# Patient Record
Sex: Female | Born: 1965 | Race: Black or African American | Hispanic: No | Smoking: Never smoker
Health system: Southern US, Community
[De-identification: ages and names within clinical notes are randomized; demographics above are authoritative.]

## PROBLEM LIST (undated history)

## (undated) DIAGNOSIS — I4891 Unspecified atrial fibrillation: Secondary | ICD-10-CM

## (undated) DIAGNOSIS — I10 Essential (primary) hypertension: Secondary | ICD-10-CM

## (undated) DIAGNOSIS — I509 Heart failure, unspecified: Secondary | ICD-10-CM

## (undated) HISTORY — PX: BACK SURGERY: SHX140

## (undated) HISTORY — PX: CARDIOVERSION: SHX1299

---

## 1999-09-10 ENCOUNTER — Emergency Department (HOSPITAL_COMMUNITY): Admission: EM | Admit: 1999-09-10 | Discharge: 1999-09-10 | Payer: Self-pay

## 2017-11-09 ENCOUNTER — Encounter (HOSPITAL_COMMUNITY): Payer: Self-pay | Admitting: Family Medicine

## 2017-11-09 ENCOUNTER — Ambulatory Visit (HOSPITAL_COMMUNITY)
Admission: EM | Admit: 2017-11-09 | Discharge: 2017-11-09 | Disposition: A | Discharge status: Home / Self Care | Payer: BLUE CROSS/BLUE SHIELD | Attending: Family Medicine | Admitting: Family Medicine

## 2017-11-09 ENCOUNTER — Other Ambulatory Visit: Payer: Self-pay

## 2017-11-09 DIAGNOSIS — J40 Bronchitis, not specified as acute or chronic: Secondary | ICD-10-CM

## 2017-11-09 HISTORY — DX: Essential (primary) hypertension: I10

## 2017-11-09 MED ORDER — HYDROCODONE-HOMATROPINE 5-1.5 MG/5ML PO SYRP: 5.0000 mL | ORAL_SOLUTION | Freq: Four times a day (QID) | ORAL | 0 refills | Status: DC | PRN

## 2017-11-09 MED ORDER — AMOXICILLIN 875 MG PO TABS: 875.0000 mg | ORAL_TABLET | Freq: Two times a day (BID) | ORAL | 0 refills | Status: DC

## 2017-11-09 NOTE — ED Triage Notes (Signed)
Pt reports nasal and chest congestion, productive cough, hoarseness and sore throat x5 days.  She also reports nightly fevers up to 101.  She has been taking OTC medication with no relief.

## 2017-11-09 NOTE — ED Provider Notes (Signed)
[facility]    CSN: [id number] Arrival date & time: 11/09/17  1418     History   Chief Complaint Chief Complaint  Patient presents with  . URI    HPI Katelyn Preston is a 52 y.o. female.   Pt reports nasal and chest congestion, productive cough, hoarseness and sore throat x5 days.  She also reports nightly fevers up to 101.  She has been taking OTC medication with no relief.  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 Hypertension     There are no active problems to display for this patient.   Past Surgical History:  Procedure Laterality Date  . BACK SURGERY    . CESAREAN SECTION      OB History   None      Home Medications    Prior to Admission medications   Medication Sig Start Date End Date Taking? Authorizing Provider  lisinopril (PRINIVIL,ZESTRIL) 20 MG tablet Take 20 mg by mouth 2 (two) times daily. 12/05/16  Yes [provider]  amoxicillin (AMOXIL) 875 MG tablet Take 1 tablet (875 mg total) by mouth 2 (two) times daily. 11/09/17   Elvina Sidle, MD  HYDROcodone-homatropine (HYDROMET) 5-1.5 MG/5ML syrup Take 5 mLs by mouth every 6 (six) hours as needed for cough. 11/09/17   Elvina Sidle, MD    Family History No family history on file.  Social History Social History   Tobacco Use  . Smoking status: Never Smoker  . Smokeless tobacco: Never Used  Substance Use Topics  . Alcohol use: Never    Frequency: Never  . Drug use: Never     Allergies   Patient has no known allergies.   Review of Systems Review of Systems  Constitutional: Positive for fever.  HENT: Positive for congestion and sore throat.   Respiratory: Positive for cough.   Gastrointestinal: Negative.   All other systems reviewed and are negative.    Physical Exam Triage Vital Signs ED Triage Vitals [11/09/17 1448]  Enc Vitals Group     BP      Pulse Rate 68     Resp      Temp    Temp Source Oral     SpO2 99 %     Weight      Height      Head Circumference      Peak Flow      Pain Score      Pain Loc      Pain Edu?      Excl. in GC?    No data found.  Updated Vital Signs BP (!) 178/113 (BP Location: Left Wrist)   Pulse 68   Temp 98.8 F (37.1 C) (Oral)   SpO2 99%    Physical Exam  Constitutional: She is oriented to person, place, and time. She appears well-developed and well-nourished.  HENT:  Head: Normocephalic.  Right Ear: External ear normal.  Left Ear: External ear normal.  Mouth/Throat: Oropharynx is clear and moist.  Normal TMs normal throat Hoarse  Eyes: Conjunctivae are normal.  Neck: Normal range of motion. Neck supple.  Cardiovascular: Normal rate, regular rhythm and normal heart sounds.  Pulmonary/Chest: Effort normal and breath sounds normal.  Musculoskeletal: Normal range of motion.  Neurological: She is alert and oriented to person, place, and time.  Skin: Skin is warm and dry.  Psychiatric: She has a normal mood and affect.  Nursing note and vitals reviewed.    UC Treatments / Results  Labs (all labs ordered are listed, but only abnormal results are displayed) Labs Reviewed - No data to display  EKG None  Radiology No results found.  Procedures Procedures (including critical care time)  Medications Ordered in UC Medications - No data to display  Initial Impression / Assessment and Plan / UC Course  I have reviewed the triage vital signs and the nursing notes.  Pertinent labs & imaging results that were available during my care of the patient were reviewed by me and considered in my medical decision making (see chart for details).     Final Clinical Impressions(s) / UC Diagnoses   Final diagnoses:  Bronchitis   Discharge Instructions   None    ED Prescriptions    Medication Sig Dispense Auth. Provider   amoxicillin (AMOXIL) 875 MG tablet Take 1 tablet (875 mg total) by mouth 2 (two) times daily. 20  tablet Elvina Sidle, MD   HYDROcodone-homatropine (HYDROMET) 5-1.5 MG/5ML syrup Take 5 mLs by mouth every 6 (six) hours as needed for cough. 60 mL Elvina Sidle, MD     Controlled Substance Prescriptions [place] Controlled Substance Registry consulted? Not Applicable   Elvina Sidle, MD 11/09/17 1500

## 2017-11-13 ENCOUNTER — Encounter (HOSPITAL_COMMUNITY): Payer: Self-pay | Admitting: Emergency Medicine

## 2017-11-13 ENCOUNTER — Ambulatory Visit (HOSPITAL_COMMUNITY)
Admission: EM | Admit: 2017-11-13 | Discharge: 2017-11-13 | Disposition: A | Discharge status: Home / Self Care | Payer: BLUE CROSS/BLUE SHIELD | Attending: Family Medicine | Admitting: Family Medicine

## 2017-11-13 ENCOUNTER — Ambulatory Visit (INDEPENDENT_AMBULATORY_CARE_PROVIDER_SITE_OTHER): Payer: BLUE CROSS/BLUE SHIELD

## 2017-11-13 DIAGNOSIS — R05 Cough: Secondary | ICD-10-CM | POA: Diagnosis not present

## 2017-11-13 DIAGNOSIS — J40 Bronchitis, not specified as acute or chronic: Secondary | ICD-10-CM

## 2017-11-13 MED ORDER — ALBUTEROL SULFATE (2.5 MG/3ML) 0.083% IN NEBU
INHALATION_SOLUTION | RESPIRATORY_TRACT | Status: AC
  Filled 2017-11-13: qty 3

## 2017-11-13 MED ORDER — METHYLPREDNISOLONE ACETATE 80 MG/ML IJ SUSP
INTRAMUSCULAR | Status: AC
  Filled 2017-11-13: qty 1

## 2017-11-13 MED ORDER — ALBUTEROL SULFATE (2.5 MG/3ML) 0.083% IN NEBU
2.5000 mg | INHALATION_SOLUTION | Freq: Once | RESPIRATORY_TRACT | Status: AC
  Administered 2017-11-13: 2.5 mg via RESPIRATORY_TRACT

## 2017-11-13 MED ORDER — METHYLPREDNISOLONE ACETATE 80 MG/ML IJ SUSP
80.0000 mg | Freq: Once | INTRAMUSCULAR | Status: AC
  Administered 2017-11-13: 80 mg via INTRAMUSCULAR

## 2017-11-13 MED ORDER — ALBUTEROL SULFATE HFA 108 (90 BASE) MCG/ACT IN AERS: 2.0000 | INHALATION_SPRAY | RESPIRATORY_TRACT | 1 refills | Status: AC | PRN

## 2017-11-13 MED ORDER — METHYLPREDNISOLONE SODIUM SUCC 125 MG IJ SOLR
INTRAMUSCULAR | Status: AC
  Filled 2017-11-13: qty 2

## 2017-11-13 NOTE — ED Provider Notes (Signed)
[facility]    CSN: [id number] Arrival date & time: 11/13/17  1045     History   Chief Complaint Chief Complaint  Patient presents with  . URI    HPI Katelyn Preston is a 52 y.o. female.   Pt here for URI sx not resolved since last visit.  Still hoarse and having trouble with uncontrollable cough.  At times she gags she is coughing so hard.  She thinks she has had a fever because she has had diaphoresis at night.  She is not able to go back to work because of the symptoms.  She has had this kind of problem before and required steroids to clear the symptoms.    Note from 10/ 4: Pt reports nasal and chest congestion, productive cough, hoarseness and sore throat x5 days.  She also reports nightly fevers up to 101.  She has been taking OTC medication with no relief.  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 Hypertension     There are no active problems to display for this patient.   Past Surgical History:  Procedure Laterality Date  . BACK SURGERY    . CESAREAN SECTION      OB History   None      Home Medications    Prior to Admission medications   Medication Sig Start Date End Date Taking? Authorizing Provider  albuterol (PROVENTIL HFA;VENTOLIN HFA) 108 (90 Base) MCG/ACT inhaler Inhale 2 puffs into the lungs every 4 (four) hours as needed for wheezing or shortness of breath (cough, shortness of breath or wheezing.). 11/13/17   Elvina Sidle, MD  amoxicillin (AMOXIL) 875 MG tablet Take 1 tablet (875 mg total) by mouth 2 (two) times daily. 11/09/17   Elvina Sidle, MD  lisinopril (PRINIVIL,ZESTRIL) 20 MG tablet Take 20 mg by mouth 2 (two) times daily. 12/05/16   [provider]    Family History History reviewed. No pertinent family history.  Social History Social History   Tobacco Use  . Smoking status: Never Smoker  . Smokeless tobacco: Never Used  Substance Use  Topics  . Alcohol use: Never    Frequency: Never  . Drug use: Never     Allergies   Patient has no known allergies.   Review of Systems Review of Systems  Constitutional: Positive for diaphoresis and fatigue.  HENT: Positive for congestion, postnasal drip and sore throat.   Respiratory: Positive for cough and wheezing.   Gastrointestinal: Positive for vomiting.  All other systems reviewed and are negative.    Physical Exam Triage Vital Signs ED Triage Vitals [11/13/17 1120]  Enc Vitals Group     BP (!) 175/109     Pulse Rate 71     Resp 18     Temp 98.1 F (36.7 C)     Temp Source Oral     SpO2 99 %     Weight      Height      Head Circumference      Peak Flow      Pain Score      Pain Loc      Pain Edu?      Excl. in GC?    No data found.  Updated Vital Signs BP (!) 175/109 (BP Location: Left Arm)   Pulse 71   Temp 98.1 F (36.7 C) (Oral)   Resp 18   SpO2  99%    Physical Exam  Constitutional: She is oriented to person, place, and time. She appears well-developed and well-nourished.  HENT:  Right Ear: External ear normal.  Left Ear: External ear normal.  Mouth/Throat: Oropharynx is clear and moist.  Hoarse  Eyes: Pupils are equal, round, and reactive to light. Conjunctivae are normal.  Neck: Normal range of motion. Neck supple.  Pulmonary/Chest: Effort normal. She has wheezes.  Musculoskeletal: Normal range of motion.  Neurological: She is alert and oriented to person, place, and time.  Skin: Skin is warm and dry.  Psychiatric: She has a normal mood and affect.  Somewhat anxious.  Patient is seen with her husband who is quite upset with the situation.  Nursing note and vitals reviewed.    UC Treatments / Results  Labs (all labs ordered are listed, but only abnormal results are displayed) Labs Reviewed - No data to display  EKG None  Radiology Dg Chest 2 View  Result Date: 11/13/2017 CLINICAL DATA:  Four days of productive cough, sore  throat, chills, and night sweats. History of bronchitis. Nonsmoker. EXAM: CHEST - 2 VIEW COMPARISON:  None. FINDINGS: The lungs are adequately inflated. The lung markings are coarse in the right lower lobe. There are no air bronchograms. The heart is top-normal in size. The pulmonary vascularity is not engorged. The mediastinum is normal in width. The bony thorax is unremarkable. IMPRESSION: Bronchitic changes which may be acute or chronic. No alveolar pneumonia nor pulmonary edema. Electronically Signed   By: David  [place] M.D.   On: 11/13/2017 12:16    Procedures Procedures (including critical care time)  Medications Ordered in UC Medications  methylPREDNISolone acetate (DEPO-MEDROL) injection 80 mg (has no administration in time range)  albuterol (PROVENTIL) (2.5 MG/3ML) 0.083% nebulizer solution 2.5 mg (2.5 mg Nebulization Given 11/13/17 1216)    Initial Impression / Assessment and Plan / UC Course  I have reviewed the triage vital signs and the nursing notes.  Pertinent labs & imaging results that were available during my care of the patient were reviewed by me and considered in my medical decision making (see chart for details).    Final Clinical Impressions(s) / UC Diagnoses   Final diagnoses:  Bronchitis     Discharge Instructions     EXAM: CHEST - 2 VIEW   COMPARISON:  None.   FINDINGS: The lungs are adequately inflated. The lung markings are coarse in the right lower lobe. There are no air bronchograms. The heart is top-normal in size. The pulmonary vascularity is not engorged. The mediastinum is normal in width. The bony thorax is unremarkable.   IMPRESSION: Bronchitic changes which may be acute or chronic. No alveolar pneumonia nor pulmonary edema.     Electronically Signed   By: David  [place] M.D.   On: 11/13/2017 12:16    ED Prescriptions    Medication Sig Dispense Auth. Provider   albuterol (PROVENTIL HFA;VENTOLIN HFA) 108 (90 Base) MCG/ACT inhaler  Inhale 2 puffs into the lungs every 4 (four) hours as needed for wheezing or shortness of breath (cough, shortness of breath or wheezing.). 1 Inhaler Elvina Sidle, MD     Controlled Substance Prescriptions [place] Controlled Substance Registry consulted? Not Applicable   Elvina Sidle, MD 11/13/17 1224

## 2017-11-13 NOTE — Discharge Instructions (Signed)
EXAM: CHEST - 2 VIEW   COMPARISON:  None.   FINDINGS: The lungs are adequately inflated. The lung markings are coarse in the right lower lobe. There are no air bronchograms. The heart is top-normal in size. The pulmonary vascularity is not engorged. The mediastinum is normal in width. The bony thorax is unremarkable.   IMPRESSION: Bronchitic changes which may be acute or chronic. No alveolar pneumonia nor pulmonary edema.     Electronically Signed   By: David  [place] M.D.   On: 11/13/2017 12:16

## 2017-11-13 NOTE — ED Triage Notes (Signed)
Pt here for URI sx not resolved since last visit

## 2019-02-10 ENCOUNTER — Other Ambulatory Visit
Admission: RE | Admit: 2019-02-10 | Discharge: 2019-02-10 | Disposition: A | Discharge status: Home / Self Care | Payer: BLUE CROSS/BLUE SHIELD | Source: Ambulatory Visit | Attending: Internal Medicine | Admitting: Internal Medicine

## 2019-02-10 DIAGNOSIS — R0600 Dyspnea, unspecified: Secondary | ICD-10-CM | POA: Insufficient documentation

## 2019-02-10 LAB — D-DIMER, QUANTITATIVE (NOT AT ARMC): D-Dimer, Quant: 0.4 ug/mL-FEU (ref 0.00–0.50)

## 2019-02-10 LAB — BRAIN NATRIURETIC PEPTIDE: B Natriuretic Peptide: 19 pg/mL (ref 0.0–100.0)

## 2020-07-06 ENCOUNTER — Ambulatory Visit
Admission: EM | Admit: 2020-07-06 | Discharge: 2020-07-06 | Disposition: A | Discharge status: Home / Self Care | Payer: BC Managed Care – PPO

## 2020-07-06 ENCOUNTER — Encounter: Payer: Self-pay | Admitting: Emergency Medicine

## 2020-07-06 ENCOUNTER — Other Ambulatory Visit: Payer: Self-pay

## 2020-07-06 DIAGNOSIS — R220 Localized swelling, mass and lump, head: Secondary | ICD-10-CM

## 2020-07-06 DIAGNOSIS — K047 Periapical abscess without sinus: Secondary | ICD-10-CM

## 2020-07-06 HISTORY — DX: Unspecified atrial fibrillation: I48.91

## 2020-07-06 HISTORY — DX: Heart failure, unspecified: I50.9

## 2020-07-06 MED ORDER — CLINDAMYCIN HCL 150 MG PO CAPS: 150.0000 mg | ORAL_CAPSULE | Freq: Three times a day (TID) | ORAL | 0 refills | Status: DC

## 2020-07-06 MED ORDER — CHLORHEXIDINE GLUCONATE 0.12 % MT SOLN: 15.0000 mL | Freq: Two times a day (BID) | OROMUCOSAL | 0 refills | Status: AC

## 2020-07-06 NOTE — ED Provider Notes (Signed)
[facility]    CSN: [id number] Arrival date & time: 07/06/20  4008      History   Chief Complaint No chief complaint on file.   HPI Katelyn Preston is a 55 y.o. female.   HPI  Patient presents today for evaluation of facial swelling.  Patient reports having dental carry and has had some tooth pain over the last couple days however this morning upon awakening her right upper facial jaw was swollen and tender to touch along with the upper right bridge of her gum is also swollen.  She is afebrile.  Reports she normally receives clindamycin for management of oral infections.  Past Medical History:  Diagnosis Date  . Hypertension     There are no problems to display for this patient.   Past Surgical History:  Procedure Laterality Date  . BACK SURGERY    . CESAREAN SECTION      OB History   No obstetric history on file.      Home Medications    Prior to Admission medications   Medication Sig Start Date End Date Taking? Authorizing Provider  albuterol (PROVENTIL HFA;VENTOLIN HFA) 108 (90 Base) MCG/ACT inhaler Inhale 2 puffs into the lungs every 4 (four) hours as needed for wheezing or shortness of breath (cough, shortness of breath or wheezing.). 11/13/17   Elvina Sidle, MD  amoxicillin (AMOXIL) 875 MG tablet Take 1 tablet (875 mg total) by mouth 2 (two) times daily. 11/09/17   Elvina Sidle, MD  lisinopril (PRINIVIL,ZESTRIL) 20 MG tablet Take 20 mg by mouth 2 (two) times daily. 12/05/16   [provider]    Family History No family history on file.  Social History Social History   Tobacco Use  . Smoking status: Never Smoker  . Smokeless tobacco: Never Used  Vaping Use  . Vaping Use: Never used  Substance Use Topics  . Alcohol use: Never  . Drug use: Never     Allergies   Patient has no known allergies.  Review of Systems Review of Systems Pertinent negatives listed in HPI Physical Exam Triage Vital Signs ED Triage Vitals   Enc Vitals Group     BP      Pulse      Resp      Temp      Temp src      SpO2      Weight      Height      Head Circumference      Peak Flow      Pain Score      Pain Loc      Pain Edu?      Excl. in GC?    No data found.  Updated Vital Signs There were no vitals taken for this visit.  Visual Acuity Right Eye Distance:   Left Eye Distance:   Bilateral Distance:    Right Eye Near:   Left Eye Near:    Bilateral Near:     Physical Exam HENT:     Head:      Mouth/Throat:     Dentition: Abnormal dentition. Dental tenderness, gingival swelling and dental caries present.  Cardiovascular:     Rate and Rhythm: Normal rate and regular rhythm.  Pulmonary:     Effort: Pulmonary effort is normal.  Skin:    Capillary Refill: Capillary refill takes less than 2 seconds.  Neurological:     General: No focal deficit present.     Mental Status: She is  alert.  Psychiatric:        Mood and Affect: Mood normal.        Behavior: Behavior normal.        Thought Content: Thought content normal.        Judgment: Judgment normal.     UC Treatments / Results  Labs (all labs ordered are listed, but only abnormal results are displayed) Labs Reviewed - No data to display  EKG   Radiology No results found.  Procedures Procedures (including critical care time)  Medications Ordered in UC Medications - No data to display  Initial Impression / Assessment and Plan / UC Course  I have reviewed the triage vital signs and the nursing notes.  Pertinent labs & imaging results that were available during my care of the patient were reviewed by me and considered in my medical decision making (see chart for details).     Right facial swelling and gingiva swelling and dental abscess.  Patient declined prednisone due to previous elevation in blood pressure after taking prednisone.  We will treat with clindamycin.  Also prescribed Peridex oral rinse.  Patient advised to follow-up with  dental provider when she has completed antibiotics.  Final Clinical Impressions(s) / UC Diagnoses   Final diagnoses:  Right facial swelling  Gingival swelling  Dental abscess     Discharge Instructions     Follow-up with a dentist following completion of antibiotics for evaluation of source of dental infection.    ED Prescriptions    Medication Sig Dispense Auth. Provider   clindamycin (CLEOCIN) 150 MG capsule Take 1 capsule (150 mg total) by mouth 3 (three) times daily. 28 capsule Bing Neighbors, FNP   chlorhexidine (PERIDEX) 0.12 % solution Use as directed 15 mLs in the mouth or throat 2 (two) times daily. 120 mL Bing Neighbors, FNP     PDMP not reviewed this encounter.   Bing Neighbors, FNP 07/06/20 [phone]

## 2020-07-06 NOTE — Discharge Instructions (Addendum)
Follow-up with a dentist following completion of antibiotics for evaluation of source of dental infection.

## 2020-07-06 NOTE — ED Triage Notes (Signed)
Dental abscess, top, right.  States there are 3 teeth she needs to have pulled

## 2020-08-06 ENCOUNTER — Inpatient Hospital Stay (HOSPITAL_COMMUNITY): Payer: BC Managed Care – PPO

## 2020-08-06 ENCOUNTER — Emergency Department (HOSPITAL_COMMUNITY): Payer: BC Managed Care – PPO

## 2020-08-06 ENCOUNTER — Other Ambulatory Visit: Payer: Self-pay

## 2020-08-06 ENCOUNTER — Inpatient Hospital Stay (HOSPITAL_COMMUNITY)
Admission: EM | Admit: 2020-08-06 | Discharge: 2020-08-09 | DRG: 418 | Disposition: A | Discharge status: Home / Self Care | Payer: BC Managed Care – PPO | Attending: Family Medicine | Admitting: Family Medicine

## 2020-08-06 ENCOUNTER — Encounter (HOSPITAL_COMMUNITY): Payer: Self-pay | Admitting: Emergency Medicine

## 2020-08-06 DIAGNOSIS — D696 Thrombocytopenia, unspecified: Secondary | ICD-10-CM | POA: Diagnosis present

## 2020-08-06 DIAGNOSIS — K8010 Calculus of gallbladder with chronic cholecystitis without obstruction: Secondary | ICD-10-CM | POA: Diagnosis present

## 2020-08-06 DIAGNOSIS — G4733 Obstructive sleep apnea (adult) (pediatric): Secondary | ICD-10-CM

## 2020-08-06 DIAGNOSIS — Z6841 Body Mass Index (BMI) 40.0 and over, adult: Secondary | ICD-10-CM | POA: Diagnosis not present

## 2020-08-06 DIAGNOSIS — Z91018 Allergy to other foods: Secondary | ICD-10-CM

## 2020-08-06 DIAGNOSIS — I10 Essential (primary) hypertension: Secondary | ICD-10-CM | POA: Diagnosis not present

## 2020-08-06 DIAGNOSIS — Z79899 Other long term (current) drug therapy: Secondary | ICD-10-CM | POA: Diagnosis not present

## 2020-08-06 DIAGNOSIS — R1011 Right upper quadrant pain: Secondary | ICD-10-CM | POA: Diagnosis present

## 2020-08-06 DIAGNOSIS — Z20822 Contact with and (suspected) exposure to covid-19: Secondary | ICD-10-CM | POA: Diagnosis present

## 2020-08-06 DIAGNOSIS — I110 Hypertensive heart disease with heart failure: Secondary | ICD-10-CM | POA: Diagnosis present

## 2020-08-06 DIAGNOSIS — K8020 Calculus of gallbladder without cholecystitis without obstruction: Secondary | ICD-10-CM | POA: Diagnosis not present

## 2020-08-06 DIAGNOSIS — I5032 Chronic diastolic (congestive) heart failure: Secondary | ICD-10-CM | POA: Diagnosis present

## 2020-08-06 DIAGNOSIS — K838 Other specified diseases of biliary tract: Secondary | ICD-10-CM | POA: Diagnosis not present

## 2020-08-06 DIAGNOSIS — Z7901 Long term (current) use of anticoagulants: Secondary | ICD-10-CM

## 2020-08-06 DIAGNOSIS — D649 Anemia, unspecified: Secondary | ICD-10-CM | POA: Diagnosis present

## 2020-08-06 DIAGNOSIS — Z419 Encounter for procedure for purposes other than remedying health state, unspecified: Secondary | ICD-10-CM

## 2020-08-06 DIAGNOSIS — Z91012 Allergy to eggs: Secondary | ICD-10-CM | POA: Diagnosis not present

## 2020-08-06 DIAGNOSIS — I480 Paroxysmal atrial fibrillation: Secondary | ICD-10-CM | POA: Diagnosis present

## 2020-08-06 LAB — CBC WITH DIFFERENTIAL/PLATELET
Abs Immature Granulocytes: 0.03 10*3/uL (ref 0.00–0.07)
Basophils Absolute: 0 10*3/uL (ref 0.0–0.1)
Basophils Relative: 0 %
Eosinophils Absolute: 0.1 10*3/uL (ref 0.0–0.5)
Eosinophils Relative: 2 %
HCT: 31.4 % — ABNORMAL LOW (ref 36.0–46.0)
Hemoglobin: 10 g/dL — ABNORMAL LOW (ref 12.0–15.0)
Immature Granulocytes: 1 %
Lymphocytes Relative: 20 %
Lymphs Abs: 1.2 10*3/uL (ref 0.7–4.0)
MCH: 30.4 pg (ref 26.0–34.0)
MCHC: 31.8 g/dL (ref 30.0–36.0)
MCV: 95.4 fL (ref 80.0–100.0)
Monocytes Absolute: 0.3 10*3/uL (ref 0.1–1.0)
Monocytes Relative: 4 %
Neutro Abs: 4.5 10*3/uL (ref 1.7–7.7)
Neutrophils Relative %: 73 %
Platelets: 58 10*3/uL — ABNORMAL LOW (ref 150–400)
RBC: 3.29 MIL/uL — ABNORMAL LOW (ref 3.87–5.11)
RDW: 14 % (ref 11.5–15.5)
WBC: 6.2 10*3/uL (ref 4.0–10.5)
nRBC: 0 % (ref 0.0–0.2)

## 2020-08-06 LAB — IRON AND TIBC
Iron: 31 ug/dL (ref 28–170)
Saturation Ratios: 9 % — ABNORMAL LOW (ref 10.4–31.8)
TIBC: 330 ug/dL (ref 250–450)
UIBC: 299 ug/dL

## 2020-08-06 LAB — COMPREHENSIVE METABOLIC PANEL
ALT: 15 U/L (ref 0–44)
AST: 24 U/L (ref 15–41)
Albumin: 4.2 g/dL (ref 3.5–5.0)
Alkaline Phosphatase: 92 U/L (ref 38–126)
Anion gap: 5 (ref 5–15)
BUN: 12 mg/dL (ref 6–20)
CO2: 29 mmol/L (ref 22–32)
Calcium: 9.3 mg/dL (ref 8.9–10.3)
Chloride: 108 mmol/L (ref 98–111)
Creatinine, Ser: 0.72 mg/dL (ref 0.44–1.00)
GFR, Estimated: >60 mL/min (ref >60)
Glucose, Bld: 117 mg/dL — ABNORMAL HIGH (ref 70–99)
Potassium: 4.3 mmol/L (ref 3.5–5.1)
Sodium: 142 mmol/L (ref 135–145)
Total Bilirubin: 0.9 mg/dL (ref 0.3–1.2)
Total Protein: 7.8 g/dL (ref 6.5–8.1)

## 2020-08-06 LAB — PREGNANCY, URINE: Preg Test, Ur: NEGATIVE

## 2020-08-06 LAB — TROPONIN I (HIGH SENSITIVITY)
Troponin I (High Sensitivity): 4 ng/L (ref <18)
Troponin I (High Sensitivity): 4 ng/L (ref <18)

## 2020-08-06 LAB — RESP PANEL BY RT-PCR (FLU A&B, COVID) ARPGX2
Influenza A by PCR: NEGATIVE
Influenza B by PCR: NEGATIVE
SARS Coronavirus 2 by RT PCR: NEGATIVE

## 2020-08-06 LAB — FOLATE: Folate: 9.2 ng/mL (ref >5.9)

## 2020-08-06 LAB — LIPASE, BLOOD: Lipase: 35 U/L (ref 11–51)

## 2020-08-06 LAB — VITAMIN B12: Vitamin B-12: 324 pg/mL (ref 180–914)

## 2020-08-06 MED ORDER — BIOTIN 1000 MCG PO TABS: 1000.0000 ug | ORAL_TABLET | Freq: Every day | ORAL | Status: DC

## 2020-08-06 MED ORDER — LISINOPRIL 20 MG PO TABS
40.0000 mg | ORAL_TABLET | Freq: Every day | ORAL | Status: DC
  Administered 2020-08-06 – 2020-08-08 (×3): 40 mg via ORAL
  Filled 2020-08-06 (×3): qty 2

## 2020-08-06 MED ORDER — MORPHINE SULFATE (PF) 2 MG/ML IV SOLN: 1.0000 mg | INTRAVENOUS | Status: DC | PRN

## 2020-08-06 MED ORDER — ATENOLOL 50 MG PO TABS
50.0000 mg | ORAL_TABLET | Freq: Every day | ORAL | Status: DC
  Administered 2020-08-09: 50 mg via ORAL
  Filled 2020-08-06 (×4): qty 1

## 2020-08-06 MED ORDER — MAGNESIUM OXIDE -MG SUPPLEMENT 400 (240 MG) MG PO TABS
400.0000 mg | ORAL_TABLET | Freq: Every day | ORAL | Status: DC
  Administered 2020-08-06 – 2020-08-09 (×3): 400 mg via ORAL
  Filled 2020-08-06 (×7): qty 1

## 2020-08-06 MED ORDER — GADOBUTROL 1 MMOL/ML IV SOLN
10.0000 mL | Freq: Once | INTRAVENOUS | Status: AC | PRN
  Administered 2020-08-06: 10 mL via INTRAVENOUS

## 2020-08-06 MED ORDER — POTASSIUM CHLORIDE CRYS ER 20 MEQ PO TBCR
20.0000 meq | EXTENDED_RELEASE_TABLET | Freq: Every day | ORAL | Status: DC
  Administered 2020-08-06 – 2020-08-09 (×3): 20 meq via ORAL
  Filled 2020-08-06 (×3): qty 1

## 2020-08-06 MED ORDER — TORSEMIDE 20 MG PO TABS
20.0000 mg | ORAL_TABLET | ORAL | Status: DC
  Administered 2020-08-06: 20 mg via ORAL
  Filled 2020-08-06 (×3): qty 1

## 2020-08-06 MED ORDER — ACETAMINOPHEN 325 MG PO TABS: 650.0000 mg | ORAL_TABLET | Freq: Four times a day (QID) | ORAL | Status: DC | PRN

## 2020-08-06 MED ORDER — LORAZEPAM 2 MG/ML IJ SOLN
0.5000 mg | Freq: Once | INTRAMUSCULAR | Status: AC
  Administered 2020-08-06: 0.5 mg via INTRAVENOUS
  Filled 2020-08-06 (×2): qty 1

## 2020-08-06 MED ORDER — ONDANSETRON HCL 4 MG/2ML IJ SOLN
4.0000 mg | Freq: Four times a day (QID) | INTRAMUSCULAR | Status: DC | PRN
  Administered 2020-08-08: 4 mg via INTRAVENOUS
  Filled 2020-08-06: qty 2

## 2020-08-06 MED ORDER — AMLODIPINE BESYLATE 10 MG PO TABS
10.0000 mg | ORAL_TABLET | Freq: Every day | ORAL | Status: DC
  Administered 2020-08-06 – 2020-08-08 (×3): 10 mg via ORAL
  Filled 2020-08-06 (×3): qty 1

## 2020-08-06 NOTE — Progress Notes (Signed)
PHARMACIST - PHYSICIAN ORDER COMMUNICATION  CONCERNING: P&T Medication Policy on Herbal Medications  DESCRIPTION:  This patient's order for:  biotin  has been noted.  This product(s) is classified as an "herbal" or natural product. Due to a lack of definitive safety studies or FDA approval, nonstandard manufacturing practices, plus the potential risk of unknown drug-drug interactions while on inpatient medications, the Pharmacy and Therapeutics Committee does not permit the use of "herbal" or natural products of this type within [facility].   ACTION TAKEN: The pharmacy department is unable to verify this order at this time and your patient has been informed of this safety policy. Please reevaluate patient's clinical condition at discharge and address if the herbal or natural product(s) should be resumed at that time.  Dorna Leitz, PharmD, BCPS 08/06/2020 7:35 PM

## 2020-08-06 NOTE — Consult Note (Signed)
Consult received from Dr. Jacqulyn Bath in ER-chart briefly reviewed, full consult to follow.   Given common bile duct and intrahepatic ductal dilation (despite normal LFTs) recommend MRCP before surgical intervention to evaluate for distal biliary obstruction whether stone or otherwise. Also of note her platelets are low at 58- baseline above 200 on multiple labs in [facility] most recently 247, 3 weeks ago. Merits further workup if persistent.

## 2020-08-06 NOTE — Progress Notes (Signed)
Pt placed on BIPAP (dreamstation max 20 min 5) she is tolerating it well and resting. VSS.

## 2020-08-06 NOTE — ED Provider Notes (Signed)
Emergency Department Provider Note   I have reviewed the triage vital signs and the nursing notes.   HISTORY  Chief Complaint Abdominal Pain, Nausea, and Emesis   HPI Katelyn Preston is a 55 y.o. female with past medical history reviewed below including A. fib on anticoagulation presents to the emergency department with right upper quadrant abdominal pain with nausea and vomiting.  Patient has had intermittent, more mild symptoms in the past.  She describes a scan done in the past showing gallstones but was not having significant pain to prompt referral to surgery or elective cholecystectomy.  She states that today she drank a sugary coffee drink and about 30 minutes later developed severe pain which seem to radiate from the epigastric region into the chest.  She had nausea and vomiting.  She ultimately had to call EMS.  She did receive Zofran in route and is feeling better in terms of her nausea.  She is not having lower abdominal pain, dysuria/hesitancy/urgency.  No fevers or chills.  Denies feeling shortness of breath.  Past Medical History:  Diagnosis Date   A-fib [facility])    CHF (congestive heart failure) (HCC)    Hypertension     Patient Active Problem List   Diagnosis Date Noted   Dilation of common bile duct 08/06/2020   Symptomatic cholelithiasis 08/06/2020   Thrombocytopenia (HCC) 08/06/2020   Anemia 08/06/2020   AF (paroxysmal atrial fibrillation) (HCC) 08/06/2020   Chronic diastolic CHF (congestive heart failure) (HCC) 08/06/2020   HTN (hypertension) 08/06/2020   OSA (obstructive sleep apnea) 08/06/2020   Morbid obesity with BMI of 50.0-59.9, adult (HCC) 08/06/2020    Past Surgical History:  Procedure Laterality Date   BACK SURGERY     CESAREAN SECTION      Allergies Avocado and Eggs or egg-derived products  History reviewed. No pertinent family history.  Social History Social History   Tobacco Use   Smoking status: Never   Smokeless tobacco: Never   Vaping Use   Vaping Use: Never used  Substance Use Topics   Alcohol use: Never   Drug use: Never    Review of Systems  Constitutional: No fever/chills Eyes: No visual changes. ENT: No sore throat. Cardiovascular: Denies chest pain. Respiratory: Denies shortness of breath. Gastrointestinal: Positive epigastric and RUQ abdominal pain.  Positive nausea and vomiting.  No diarrhea.  No constipation. Genitourinary: Negative for dysuria. Musculoskeletal: Negative for back pain. Skin: Negative for rash. Neurological: Negative for headaches, focal weakness or numbness.  10-point ROS otherwise negative.  ____________________________________________   PHYSICAL EXAM:  VITAL SIGNS: ED Triage Vitals  Enc Vitals Group     BP 08/06/20 1530 121/79     Pulse Rate 08/06/20 1530 61     Resp 08/06/20 1532 18     Temp 08/06/20 1532 98 F (36.7 C)     Temp Source 08/06/20 1532 Oral     SpO2 08/06/20 1530 100 %     Weight 08/06/20 1532 (!) 355 lb (161 kg)     Height 08/06/20 1532 5\' 8"  (1.727 m)   Constitutional: Alert and oriented. Well appearing and in no acute distress. Eyes: Conjunctivae are normal.  Head: Atraumatic. Nose: No congestion/rhinnorhea. Mouth/Throat: Mucous membranes are moist.   Neck: No stridor.  Cardiovascular: Normal rate, regular rhythm. Good peripheral circulation. Grossly normal heart sounds.   Respiratory: Normal respiratory effort.  No retractions. Lungs CTAB. Gastrointestinal: Soft with mild epigastric tenderness. No distention.  Musculoskeletal: No lower extremity tenderness nor edema. No gross  deformities of extremities. Neurologic:  Normal speech and language. No gross focal neurologic deficits are appreciated.  Skin:  Skin is warm, dry and intact. No rash noted.   ____________________________________________   LABS (all labs ordered are listed, but only abnormal results are displayed)  Labs Reviewed  COMPREHENSIVE METABOLIC PANEL - Abnormal;  Notable for the following components:      Result Value   Glucose, Bld 117 (*)    All other components within normal limits  CBC WITH DIFFERENTIAL/PLATELET - Abnormal; Notable for the following components:   RBC 3.29 (*)    Hemoglobin 10.0 (*)    HCT 31.4 (*)    Platelets 58 (*)    All other components within normal limits  IRON AND TIBC - Abnormal; Notable for the following components:   Saturation Ratios 9 (*)    All other components within normal limits  RESP PANEL BY RT-PCR (FLU A&B, COVID) ARPGX2  LIPASE, BLOOD  PREGNANCY, URINE  VITAMIN B12  FOLATE  CBC  TROPONIN I (HIGH SENSITIVITY)  TROPONIN I (HIGH SENSITIVITY)   ____________________________________________  EKG   EKG Interpretation  Date/Time:  Friday August 06 2020 15:50:27 EDT Ventricular Rate:  70 PR Interval:  166 QRS Duration: 98 QT Interval:  422 QTC Calculation: 455 R Axis:   27 Text Interpretation: Normal sinus rhythm Normal ECG No old tracing to compare Confirmed by Alona BeneLong, Terryon Pineiro [id number]) on 08/06/2020 4:11:33 PM         ____________________________________________  RADIOLOGY  DG Chest 2 View  Result Date: 08/06/2020 CLINICAL DATA:  Chest pain EXAM: CHEST - 2 VIEW COMPARISON:  11/13/2017 FINDINGS: Mild cardiomegaly. No focal opacity or pleural effusion. No pneumothorax. IMPRESSION: No active cardiopulmonary disease.  Cardiomegaly Electronically Signed   By: Jasmine PangKim  Fujinaga M.D.   On: 08/06/2020 17:49   MR ABDOMEN MRCP W WO CONTAST  Result Date: 08/06/2020 CLINICAL DATA:  Right upper quadrant abdominal pain. Mild biliary dilatation. Gallstones. EXAM: MRI ABDOMEN WITHOUT AND WITH CONTRAST (INCLUDING MRCP) TECHNIQUE: Multiplanar multisequence MR imaging of the abdomen was performed both before and after the administration of intravenous contrast. Heavily T2-weighted images of the biliary and pancreatic ducts were obtained, and three-dimensional MRCP images were rendered by post processing. CONTRAST:  10mL  GADAVIST GADOBUTROL 1 MMOL/ML IV SOLN COMPARISON:  08/06/2020 FINDINGS: Despite efforts by the technologist and patient, motion artifact is present on today's exam and could not be eliminated. This reduces exam sensitivity and specificity. Body habitus reduces diagnostic sensitivity and specificity. Lower chest: Cardiomegaly. Hepatobiliary: 2.8 cm hemangioma in the left hepatic lobe as shown on image 22 series 34. Several large gallstones are present in the gallbladder measuring up to 2.7 cm in diameter. The common hepatic duct measures 0.7 cm in diameter. The common bile duct measures 0.6 cm in diameter, mildly dilated. On image 7 of series 8, I suspect a small distal CBD stone, also suggested on 24-25 of series 3. Admittedly this is less apparent on other sequences. No abnormal enhancement along the biliary tree. No significant intrahepatic biliary dilatation. Pancreas:  Pancreas divisum. Spleen:  Unremarkable Adrenals/Urinary Tract:  Unremarkable Stomach/Bowel: Unremarkable Vascular/Lymphatic:  Unremarkable Other:  No supplemental non-categorized findings. Musculoskeletal: Lower lumbar degenerative disc disease and degenerative endplate findings. IMPRESSION: 1. Cholelithiasis with mild extrahepatic biliary dilatation and suspicion for a small distal CBD stone on some sequences. Diagnostic accuracy is adversely affected by motion artifact and patient body habitus. 2. 2.8 cm hemangioma in the left hepatic lobe. 3. Cardiomegaly. 4. Pancreas divisum. 5.  Lower lumbar degenerative disc disease. Electronically Signed   By: Gaylyn Rong M.D.   On: 08/06/2020 22:01   US Abdomen Limited RUQ (LIVER/GB)  Result Date: 08/06/2020 CLINICAL DATA:  Right upper quadrant pain EXAM: ULTRASOUND ABDOMEN LIMITED RIGHT UPPER QUADRANT COMPARISON:  None. FINDINGS: Gallbladder: Partially distended with multiple gallstones within. No pericholecystic fluid is noted. Negative sonographic Murphy's sign is elicited. Common bile duct:  Diameter: 9.4 mm. Liver: Increased in echogenicity consistent with fatty infiltration. Intrahepatic biliary ductal dilatation is noted as well. Portal vein is patent on color Doppler imaging with normal direction of blood flow towards the liver. Other: None. IMPRESSION: Multiple gallstones within a partially distended gallbladder. Dilated common bile duct 9.4 mm with intrahepatic ductal dilatation. This raises suspicion for distal common bile duct stone. Electronically Signed   By: Alcide Clever M.D.   On: 08/06/2020 16:24    ____________________________________________   PROCEDURES  Procedure(s) performed:   Procedures  None  ____________________________________________   INITIAL IMPRESSION / ASSESSMENT AND PLAN / ED COURSE  Pertinent labs & imaging results that were available during my care of the patient were reviewed by me and considered in my medical decision making (see chart for details).   Patient presents emergency department for cute onset epigastric abdominal pain and vomiting.  Notes a history of gallstones although not particularly tender in the right upper quadrant.  Her story and risk factors GU correlate with gallbladder disease.  Plan for right upper quadrant ultrasound.  Considered atypical ACS and will obtain troponins along with chest x-ray.  EKG is relatively unremarkable as interpreted by me as above.   06:00 PM  He ultrasound the gallbladder shows ductal dilation and multiple gallstones.  No acute cholecystitis.  Labs are reassuring with normal LFTs, bilirubin, WBC count.  Patient's pain is improved although still present to some degree.  No vomiting here.  Discussed the case with Dr. Doylene Canard with general surgery.  She would prefer MRCP with medicine admission for pain control overnight and they can consult on the morning after MRCP results are available to decide on timing for lap chole vs ERCP with GI.   Discussed patient's case with [facility] to request admission. Patient  and family (if present) updated with plan. Care transferred to [facility] service.  I reviewed all nursing notes, vitals, pertinent old records, EKGs, labs, imaging (as available).  ____________________________________________  FINAL CLINICAL IMPRESSION(S) / ED DIAGNOSES  Final diagnoses:  RUQ abdominal pain  Symptomatic cholelithiasis     MEDICATIONS GIVEN DURING THIS VISIT:  Medications  ondansetron (ZOFRAN) injection 4 mg (has no administration in time range)  morphine 2 MG/ML injection 1 mg (has no administration in time range)  acetaminophen (TYLENOL) tablet 650 mg (has no administration in time range)  amLODipine (NORVASC) tablet 10 mg (10 mg Oral Given 08/06/20 2243)  lisinopril (ZESTRIL) tablet 40 mg (40 mg Oral Given 08/06/20 2242)  torsemide (DEMADEX) tablet 20 mg (20 mg Oral Given 08/06/20 2242)  atenolol (TENORMIN) tablet 50 mg (50 mg Oral Not Given 08/06/20 2243)  magnesium oxide (MAG-OX) tablet 400 mg (400 mg Oral Given 08/06/20 2242)  potassium chloride SA (KLOR-CON) CR tablet 20 mEq (20 mEq Oral Given 08/06/20 2243)  LORazepam (ATIVAN) injection 0.5 mg (0.5 mg Intravenous Given 08/06/20 2020)  gadobutrol (GADAVIST) 1 MMOL/ML injection 10 mL (10 mLs Intravenous Contrast Given 08/06/20 2148)      Note:  This document was prepared using Dragon voice recognition software and may include unintentional dictation errors.  Ivin Booty  Jacqulyn Bath, MD, [facility] Emergency Medicine    Taccara Bushnell, Arlyss Repress, MD 08/06/20 2330

## 2020-08-06 NOTE — H&P (Signed)
History and Physical    Katelyn Preston ZRA:[id number] DOB: 1965-07-24 DOA: 08/06/2020  PCP: Patient, No Pcp Per (Inactive)  Patient coming from: Home, daughter at bedside  I have personally briefly reviewed patient's old medical records in [facility]  Chief Complaint: Abdominal pain, nausea  HPI: Katelyn Preston is a 55 y.o. female with medical history significant for hypertension, chronic diastolic heart failure, paroxysmal atrial fibrillation on Eliquis, OSA on CPAP, prediabetes and morbid obesity who presents with abdominal pain and nausea.  For the past year she has noticed 2-3 episodes of epigastric to right upper quadrant abdominal pain every time that she eats the "wrong food."  More specifically fast food.  States her pain would last for an hour and would resolve.  It also would get better when she tries to eat a different type of food.  However today after drinking a mocha she had severe epigastric abdominal pain that was worse than her prior episodes.  States she broke out in a sweat.  Also had nausea and vomiting which prompted her to present to the ED. She denies fever with any of these episodes.  No diarrhea.  Has history of C-section.  Patient also is currently being evaluated at [facility] general surgery for potential gastric bypass for her weight.  She denies tobacco, alcohol illicit drug use.  ED Course: She was afebrile, normotensive and bradycardic with heart rate in the upper 50s.  CBC shows no leukocytosis but had anemia with hemoglobin of 10.  Platelet of 58.  LFTs and total bili were normal. Ultrasound showed negative for cholecystitis but had multiple gallstones and intrahepatic ductal dilatation.  General surgery Dr. Doylene Canard was consulted by ED physician Dr. Jacqulyn Bath who recommended MRCP prior to surgical intervention given findings of normal LFTs despite common bile duct and intrahepatic ductal dilatation. She took her last dose of Eliquis the evening of  6/30.  Review of Systems: Constitutional: No Weight Change, No Fever ENT/Mouth: No sore throat, No Rhinorrhea Eyes: No Eye Pain, No Vision Changes Cardiovascular: No Chest Pain, no SOB, No PND, No Dyspnea on Exertion, No Orthopnea, No Claudication, No Edema, No Palpitations Respiratory: No Cough, No Sputum, No Wheezing, no Dyspnea  Gastrointestinal: No Nausea, No Vomiting, No Diarrhea, No Constipation, No Pain Genitourinary: no Urinary Incontinence, No Urgency, No Flank Pain Musculoskeletal: No Arthralgias, No Myalgias Skin: No Skin Lesions, No Pruritus, Neuro: no Weakness, No Numbness,  No Loss of Consciousness, No Syncope Psych: No Anxiety/Panic, No Depression, no decrease appetite Heme/Lymph: No Bruising, No Bleeding  Past Medical History:  Diagnosis Date   A-fib (HCC)    CHF (congestive heart failure) (HCC)    Hypertension     Past Surgical History:  Procedure Laterality Date   BACK SURGERY     CESAREAN SECTION       reports that she has never smoked. She has never used smokeless tobacco. She reports that she does not drink alcohol and does not use drugs. Social History  No Known Allergies  History reviewed. No pertinent family history.   Prior to Admission medications   Medication Sig Start Date End Date Taking? Authorizing Provider  albuterol (PROVENTIL HFA;VENTOLIN HFA) 108 (90 Base) MCG/ACT inhaler Inhale 2 puffs into the lungs every 4 (four) hours as needed for wheezing or shortness of breath (cough, shortness of breath or wheezing.). 11/13/17   Elvina Sidle, MD  amLODipine (NORVASC) 10 MG tablet Take 10 mg by mouth daily.    [provider]  apixaban Everlene Balls)  5 MG TABS tablet Take 5 mg by mouth 2 (two) times daily.    [provider]  atenolol (TENORMIN) 50 MG tablet Take 50 mg by mouth daily.    [provider]  chlorhexidine (PERIDEX) 0.12 % solution Use as directed 15 mLs in the mouth or throat 2 (two) times daily. 07/06/20    Bing Neighbors, FNP  clindamycin (CLEOCIN) 150 MG capsule Take 1 capsule (150 mg total) by mouth 3 (three) times daily. 07/06/20   Bing Neighbors, FNP  lisinopril (PRINIVIL,ZESTRIL) 20 MG tablet Take 20 mg by mouth 2 (two) times daily. 12/05/16   [provider]  POTASSIUM CHLORIDE ER PO Take 20 mEq by mouth 2 (two) times daily.    [provider]  torsemide (DEMADEX) 20 MG tablet Take 20 mg by mouth daily.    [provider]    Physical Exam: Vitals:   08/06/20 1715 08/06/20 1730 08/06/20 1745 08/06/20 1830  BP:  131/87  135/76  Pulse: (!) 59 (!) 58 (!) 53 (!) 58  Resp: 16 17 15 16   Temp:      TempSrc:      SpO2: 100% 100% 99% 98%  Weight:      Height:        Constitutional: NAD, calm, comfortable, morbidly obese female laying at 40 degree incline in bed Vitals:   08/06/20 1715 08/06/20 1730 08/06/20 1745 08/06/20 1830  BP:  131/87  135/76  Pulse: (!) 59 (!) 58 (!) 53 (!) 58  Resp: 16 17 15 16   Temp:      TempSrc:      SpO2: 100% 100% 99% 98%  Weight:      Height:       Eyes: PERRL, lids and conjunctivae normal ENMT: Mucous membranes are moist.  Neck: normal, supple, no masses, no thyromegaly Respiratory: clear to auscultation bilaterally, no wheezing, no crackles. Normal respiratory effort. No accessory muscle use.  Cardiovascular: Regular rate and rhythm, no murmurs / rubs / gallops. Non-pitting lower extremity edema bilaterally.  Abdomen:soft, mild epigastric tenderness, no rebound tenderness, guarding or rigidity no masses palpated.  Bowel sounds positive.  Musculoskeletal: no clubbing / cyanosis. No joint deformity upper and lower extremities. Good ROM, no contractures. Normal muscle tone.  Skin: no rashes, lesions, ulcers. No induration Neurologic: CN 2-12 grossly intact. Sensation intact,  Strength 5/5 in all 4.  Psychiatric: Normal judgment and insight. Alert and oriented x 3. Normal mood.     Labs on Admission: I have personally  reviewed following labs and imaging studies  CBC: Recent Labs  Lab 08/06/20 1555  WBC 6.2  NEUTROABS 4.5  HGB 10.0*  HCT 31.4*  MCV 95.4  PLT 58*   Basic Metabolic Panel: Recent Labs  Lab 08/06/20 1555  NA 142  K 4.3  CL 108  CO2 29  GLUCOSE 117*  BUN 12  CREATININE 0.72  CALCIUM 9.3   GFR: Estimated Creatinine Clearance: 130.3 mL/min (by C-G formula based on SCr of 0.72 mg/dL). Liver Function Tests: Recent Labs  Lab 08/06/20 1555  AST 24  ALT 15  ALKPHOS 92  BILITOT 0.9  PROT 7.8  ALBUMIN 4.2   Recent Labs  Lab 08/06/20 1555  LIPASE 35   No results for input(s): AMMONIA in the last 168 hours. Coagulation Profile: No results for input(s): INR, PROTIME in the last 168 hours. Cardiac Enzymes: No results for input(s): CKTOTAL, CKMB, CKMBINDEX, TROPONINI in the last 168 hours. BNP (last 3 results)  No results for input(s): PROBNP in the last 8760 hours. HbA1C: No results for input(s): HGBA1C in the last 72 hours. CBG: No results for input(s): GLUCAP in the last 168 hours. Lipid Profile: No results for input(s): CHOL, HDL, LDLCALC, TRIG, CHOLHDL, LDLDIRECT in the last 72 hours. Thyroid Function Tests: No results for input(s): TSH, T4TOTAL, FREET4, T3FREE, THYROIDAB in the last 72 hours. Anemia Panel: No results for input(s): VITAMINB12, FOLATE, FERRITIN, TIBC, IRON, RETICCTPCT in the last 72 hours. Urine analysis: No results found for: COLORURINE, APPEARANCEUR, LABSPEC, PHURINE, GLUCOSEU, HGBUR, BILIRUBINUR, KETONESUR, PROTEINUR, UROBILINOGEN, NITRITE, LEUKOCYTESUR  Radiological Exams on Admission: DG Chest 2 View  Result Date: 08/06/2020 CLINICAL DATA:  Chest pain EXAM: CHEST - 2 VIEW COMPARISON:  11/13/2017 FINDINGS: Mild cardiomegaly. No focal opacity or pleural effusion. No pneumothorax. IMPRESSION: No active cardiopulmonary disease.  Cardiomegaly Electronically Signed   By: Jasmine Pang M.D.   On: 08/06/2020 17:49   US Abdomen Limited RUQ  (LIVER/GB)  Result Date: 08/06/2020 CLINICAL DATA:  Right upper quadrant pain EXAM: ULTRASOUND ABDOMEN LIMITED RIGHT UPPER QUADRANT COMPARISON:  None. FINDINGS: Gallbladder: Partially distended with multiple gallstones within. No pericholecystic fluid is noted. Negative sonographic Murphy's sign is elicited. Common bile duct: Diameter: 9.4 mm. Liver: Increased in echogenicity consistent with fatty infiltration. Intrahepatic biliary ductal dilatation is noted as well. Portal vein is patent on color Doppler imaging with normal direction of blood flow towards the liver. Other: None. IMPRESSION: Multiple gallstones within a partially distended gallbladder. Dilated common bile duct 9.4 mm with intrahepatic ductal dilatation. This raises suspicion for distal common bile duct stone. Electronically Signed   By: Alcide Clever M.D.   On: 08/06/2020 16:24      Assessment/Plan  Symptomatic cholelithiasis with CBD and intrahepatic ductal dilation -normal LFTs -Surgery recommends MRCP prior to any intervention. Might need ERCP pending results -PRN pain managment  -NPO after midnight. Hold Eliquis for potential intervention in the morning  Thrombocytopenia -plt of 87 today. 3 weeks ago at 247. Will repeat in the morning before further eval  Anemia -previously normal at Hgb of 12 on 07/08/20 -obtain Iron studies, vitamin B12 and folic   Paroxysmal atrial fibrillation -CHA2DS2-VASc score of 3 (gender, CHF, HTN) -Hold Eliquis for potential intervention.  Last dose the evening of 6/30.  Chronic diastolic heart failure - Patient appears euvolemic -continue atenolol, Torsemide  Hypertension -Continue amlodipine, lisinopril  OSA - Continue CPAP  Morbid obesity - BMI of greater than 50.  Patient currently being evaluated by general surgery at [facility] for potential gastric bypass   DVT prophylaxis:SCDs Code Status: Full Family Communication: Plan discussed with patient and daughter at bedside  disposition  Plan: Home with at least 2 midnight stays  Consults called:  Admission status: inpatient  Level of care: Med-Surg  Status is: Inpatient  Remains inpatient appropriate because:Inpatient level of care appropriate due to severity of illness  Dispo: The patient is from: Home              Anticipated d/c is to: Home              Patient currently is not medically stable to d/c.   Difficult to place patient No         Anselm Jungling DO [facility]   If 7PM-7AM, please contact night-coverage [email or web address]   08/06/2020, 7:09 PM

## 2020-08-06 NOTE — ED Triage Notes (Signed)
The patient presents from home with a sudden onset of nausea, vomiting and upper right quadrant abdominal pain that is tender to palpation. She believes this may be gallbladder related. EMS administered 4 mg of Zofran.   EMS vitals: 130/78 BP 90 HR 16 Resp Rate 98% O2 sat on room air 120 CBG

## 2020-08-06 NOTE — Progress Notes (Signed)
Went to Room 1310 to do pt's nursing admission history. Had attempted it earlier in ER but pt said she wanted to go home and did not want to do history. She wanted to talk to the doctor. Pt's rn states pt is in MRI currently.  Briscoe Burns BSN, RN-BC Admissions RN 08/06/2020 9:12 PM

## 2020-08-06 NOTE — Progress Notes (Signed)
Went to do nursing admission history on this patient. She states she does not want to stay and she would "like to go home on a liquid diet or something." She is asking to speak with her doctor. She states she recently got out of the hospital and does not want to stay. I told her that I would let her nurse know. Briscoe Burns BSN, RN-BC Admissions RN 08/06/2020 6:24 PM

## 2020-08-07 ENCOUNTER — Encounter (HOSPITAL_COMMUNITY): Payer: Self-pay | Admitting: Family Medicine

## 2020-08-07 DIAGNOSIS — K838 Other specified diseases of biliary tract: Secondary | ICD-10-CM

## 2020-08-07 LAB — COMPREHENSIVE METABOLIC PANEL
ALT: 15 U/L (ref 0–44)
AST: 16 U/L (ref 15–41)
Albumin: 3.7 g/dL (ref 3.5–5.0)
Alkaline Phosphatase: 87 U/L (ref 38–126)
Anion gap: 8 (ref 5–15)
BUN: 9 mg/dL (ref 6–20)
CO2: 28 mmol/L (ref 22–32)
Calcium: 9 mg/dL (ref 8.9–10.3)
Chloride: 105 mmol/L (ref 98–111)
Creatinine, Ser: 0.81 mg/dL (ref 0.44–1.00)
GFR, Estimated: >60 mL/min (ref >60)
Glucose, Bld: 105 mg/dL — ABNORMAL HIGH (ref 70–99)
Potassium: 3.7 mmol/L (ref 3.5–5.1)
Sodium: 141 mmol/L (ref 135–145)
Total Bilirubin: 0.8 mg/dL (ref 0.3–1.2)
Total Protein: 7.1 g/dL (ref 6.5–8.1)

## 2020-08-07 LAB — CBC
HCT: 35 % — ABNORMAL LOW (ref 36.0–46.0)
Hemoglobin: 11.2 g/dL — ABNORMAL LOW (ref 12.0–15.0)
MCH: 30.3 pg (ref 26.0–34.0)
MCHC: 32 g/dL (ref 30.0–36.0)
MCV: 94.6 fL (ref 80.0–100.0)
Platelets: 199 10*3/uL (ref 150–400)
RBC: 3.7 MIL/uL — ABNORMAL LOW (ref 3.87–5.11)
RDW: 14 % (ref 11.5–15.5)
WBC: 6.8 10*3/uL (ref 4.0–10.5)
nRBC: 0 % (ref 0.0–0.2)

## 2020-08-07 MED ORDER — CEFAZOLIN SODIUM-DEXTROSE 2-4 GM/100ML-% IV SOLN
2.0000 g | INTRAVENOUS | Status: AC
  Administered 2020-08-08: 2 g via INTRAVENOUS
  Filled 2020-08-07: qty 100

## 2020-08-07 NOTE — Progress Notes (Signed)
Pt prefer self placement on cpap. Advised to let RN know if help needed.

## 2020-08-07 NOTE — Consult Note (Addendum)
Surgical Evaluation Requesting provider: Dr. Jacqulyn Bath  Chief Complaint: abdominal pain  HPI: 55 year old woman with A. fib on Eliquis, CHF, hypertension, sleep apnea, morbid obesity who presented to the emergency department with epigastric pain, nausea and vomiting.  She has had this intermittently in a more mild fashion in the past and is known to have gallstones.  This episode was more severe, and unrelenting.  This pain was in the epigastrium and radiating into the chest.  Associated with nausea and vomiting.  She had to call EMS.  Received Zofran en route and had some symptomatic improvement before arriving in the ER.  Underwent lab work and imaging including right upper quadrant ultrasound which demonstrated gallstones without sonographic evidence of cholecystitis however there was noted to be common bile duct and intrahepatic biliary dilatation.  Also noted on her lab work was a platelet count of 58, whereas her baseline is well over 200 including 3 weeks ago at [facility] where it was 247.  No leukocytosis, no elevation in her LFTs. This morning she states that she feels completely better, denies any nausea or abdominal pain throughout the night.  Allergies  Allergen Reactions   Avocado Nausea And Vomiting   Eggs Or Egg-Derived Products Nausea And Vomiting    Past Medical History:  Diagnosis Date   A-fib (HCC)    CHF (congestive heart failure) (HCC)    Hypertension     Past Surgical History:  Procedure Laterality Date   BACK SURGERY     CESAREAN SECTION      History reviewed. No pertinent family history.  Social History   Socioeconomic History   Marital status: Divorced    Spouse name: Not on file   Number of children: Not on file   Years of education: Not on file   Highest education level: Not on file  Occupational History   Not on file  Tobacco Use   Smoking status: Never   Smokeless tobacco: Never  Vaping Use   Vaping Use: Never used  Substance and Sexual Activity    Alcohol use: Never   Drug use: Never   Sexual activity: Not on file  Other Topics Concern   Not on file  Social History Narrative   Not on file   Social Determinants of Health   Financial Resource Strain: Not on file  Food Insecurity: Not on file  Transportation Needs: Not on file  Physical Activity: Not on file  Stress: Not on file  Social Connections: Not on file    No current facility-administered medications on file prior to encounter.   Current Outpatient Medications on File Prior to Encounter  Medication Sig Dispense Refill   albuterol (PROVENTIL HFA;VENTOLIN HFA) 108 (90 Base) MCG/ACT inhaler Inhale 2 puffs into the lungs every 4 (four) hours as needed for wheezing or shortness of breath (cough, shortness of breath or wheezing.). 1 Inhaler 1   amLODipine (NORVASC) 10 MG tablet Take 10 mg by mouth daily.     apixaban (ELIQUIS) 5 MG TABS tablet Take 10 mg by mouth daily.     atenolol (TENORMIN) 50 MG tablet Take 50 mg by mouth daily.     Biotin 1000 MCG tablet Take 1,000 mcg by mouth daily.     chlorhexidine (PERIDEX) 0.12 % solution Use as directed 15 mLs in the mouth or throat 2 (two) times daily. (Patient taking differently: Use as directed 15 mLs in the mouth or throat daily. evening) 120 mL 0   ibuprofen (ADVIL) 200 MG tablet Take  600 mg by mouth every 6 (six) hours as needed for mild pain.     lisinopril (PRINIVIL,ZESTRIL) 20 MG tablet Take 40 mg by mouth daily.     magnesium oxide (MAG-OX) 400 MG tablet Take 400 mg by mouth daily.     Multiple Vitamin (MULTI-VITAMIN) tablet Take 1 tablet by mouth every morning.     potassium chloride (KLOR-CON) 10 MEQ tablet Take 20 mEq by mouth daily.     torsemide (DEMADEX) 20 MG tablet Take 20 mg by mouth every other day.     clindamycin (CLEOCIN) 150 MG capsule Take 1 capsule (150 mg total) by mouth 3 (three) times daily. (Patient not taking: No sig reported) 28 capsule 0    Review of Systems: a complete, 10pt review of systems  was completed with pertinent positives and negatives as documented in the HPI  Physical Exam: Vitals:   08/06/20 1955 08/06/20 2256  BP: (!) 140/96   Pulse: (!) 54 (!) 54  Resp: 18 18  Temp: 97.8 F (36.6 C)   SpO2: 99% 100%   Gen: A&Ox3, no distress  Eyes: lids and conjunctivae normal, no icterus. Pupils equally round and reactive to light.  Neck: supple without mass or thyromegaly Chest: respiratory effort is normal. No crepitus or tenderness on palpation of the chest. Breath sounds equal.  Cardiovascular: RRR with palpable distal pulses, no pedal edema Gastrointestinal: soft, nondistended, nontender. No mass, hepatomegaly or splenomegaly.  Lymphatic: no lymphadenopathy in the neck or groin Muscoloskeletal: no clubbing or cyanosis of the fingers.  Strength is symmetrical throughout.  Range of motion of bilateral upper and lower extremities normal without pain, crepitation or contracture. Neuro: cranial nerves grossly intact.  Sensation intact to light touch diffusely. Psych: appropriate mood and affect, normal insight/judgment intact  Skin: warm and dry   CBC Latest Ref Rng & Units 08/06/2020  WBC 4.0 - 10.5 K/uL 6.2  Hemoglobin 12.0 - 15.0 g/dL 10.0(L)  Hematocrit 36.0 - 46.0 % 31.4(L)  Platelets 150 - 400 K/uL 58(L)    CMP Latest Ref Rng & Units 08/06/2020  Glucose 70 - 99 mg/dL 921(J)  BUN 6 - 20 mg/dL 12  Creatinine 9.41 - 7.40 mg/dL 8.14  Sodium 481 - 856 mmol/L 142  Potassium 3.5 - 5.1 mmol/L 4.3  Chloride 98 - 111 mmol/L 108  CO2 22 - 32 mmol/L 29  Calcium 8.9 - 10.3 mg/dL 9.3  Total Protein 6.5 - 8.1 g/dL 7.8  Total Bilirubin 0.3 - 1.2 mg/dL 0.9  Alkaline Phos 38 - 126 U/L 92  AST 15 - 41 U/L 24  ALT 0 - 44 U/L 15    No results found for: INR, PROTIME  Imaging: DG Chest 2 View  Result Date: 08/06/2020 CLINICAL DATA:  Chest pain EXAM: CHEST - 2 VIEW COMPARISON:  11/13/2017 FINDINGS: Mild cardiomegaly. No focal opacity or pleural effusion. No pneumothorax.  IMPRESSION: No active cardiopulmonary disease.  Cardiomegaly Electronically Signed   By: Jasmine Pang M.D.   On: 08/06/2020 17:49   MR ABDOMEN MRCP W WO CONTAST  Result Date: 08/06/2020 CLINICAL DATA:  Right upper quadrant abdominal pain. Mild biliary dilatation. Gallstones. EXAM: MRI ABDOMEN WITHOUT AND WITH CONTRAST (INCLUDING MRCP) TECHNIQUE: Multiplanar multisequence MR imaging of the abdomen was performed both before and after the administration of intravenous contrast. Heavily T2-weighted images of the biliary and pancreatic ducts were obtained, and three-dimensional MRCP images were rendered by post processing. CONTRAST:  42mL GADAVIST GADOBUTROL 1 MMOL/ML IV SOLN COMPARISON:  08/06/2020  FINDINGS: Despite efforts by the technologist and patient, motion artifact is present on today's exam and could not be eliminated. This reduces exam sensitivity and specificity. Body habitus reduces diagnostic sensitivity and specificity. Lower chest: Cardiomegaly. Hepatobiliary: 2.8 cm hemangioma in the left hepatic lobe as shown on image 22 series 34. Several large gallstones are present in the gallbladder measuring up to 2.7 cm in diameter. The common hepatic duct measures 0.7 cm in diameter. The common bile duct measures 0.6 cm in diameter, mildly dilated. On image 7 of series 8, I suspect a small distal CBD stone, also suggested on 24-25 of series 3. Admittedly this is less apparent on other sequences. No abnormal enhancement along the biliary tree. No significant intrahepatic biliary dilatation. Pancreas:  Pancreas divisum. Spleen:  Unremarkable Adrenals/Urinary Tract:  Unremarkable Stomach/Bowel: Unremarkable Vascular/Lymphatic:  Unremarkable Other:  No supplemental non-categorized findings. Musculoskeletal: Lower lumbar degenerative disc disease and degenerative endplate findings. IMPRESSION: 1. Cholelithiasis with mild extrahepatic biliary dilatation and suspicion for a small distal CBD stone on some sequences.  Diagnostic accuracy is adversely affected by motion artifact and patient body habitus. 2. 2.8 cm hemangioma in the left hepatic lobe. 3. Cardiomegaly. 4. Pancreas divisum. 5. Lower lumbar degenerative disc disease. Electronically Signed   By: Gaylyn RongWalter  Liebkemann M.D.   On: 08/06/2020 22:01   US Abdomen Limited RUQ (LIVER/GB)  Result Date: 08/06/2020 CLINICAL DATA:  Right upper quadrant pain EXAM: ULTRASOUND ABDOMEN LIMITED RIGHT UPPER QUADRANT COMPARISON:  None. FINDINGS: Gallbladder: Partially distended with multiple gallstones within. No pericholecystic fluid is noted. Negative sonographic Murphy's sign is elicited. Common bile duct: Diameter: 9.4 mm. Liver: Increased in echogenicity consistent with fatty infiltration. Intrahepatic biliary ductal dilatation is noted as well. Portal vein is patent on color Doppler imaging with normal direction of blood flow towards the liver. Other: None. IMPRESSION: Multiple gallstones within a partially distended gallbladder. Dilated common bile duct 9.4 mm with intrahepatic ductal dilatation. This raises suspicion for distal common bile duct stone. Electronically Signed   By: Alcide CleverMark  Lukens M.D.   On: 08/06/2020 16:24     A/P: 55 year old woman with morbid obesity, A. fib on anticoagulation, presents with symptomatic cholelithiasis and abnormally dilated biliary tract on ultrasound.  MRCP subsequently completed with findings of cardiomegaly, left hepatic lobe 2.8 cm hemangioma, pancreas divisum, gallstones up to 2.7 cm in diameter and a suspected small distal common bile duct stone with mild extrahepatic biliary dilation.  Ultimately would benefit from cholecystectomy this admission.  At this time, the patient does not wish to have gallbladder surgery.  We discussed the risks and benefits of surgery versus no surgery; the latter carries a fairly high probability of recurrent attacks of biliary colic or recurrent choledocholithiasis/pancreatitis in the next few months  which may or may not be worse than the present event.  She understands, and will continue to think about it but at this time does not consent to surgery.  Given cardiac history and cardiomegaly noted on CT, briefly discussed case with cardiology and no further workup is needed at this time.  GI has already been consulted and will defer timing of ERCP.  Please hold eliquis.   Patient Active Problem List   Diagnosis Date Noted   Dilation of common bile duct 08/06/2020   Symptomatic cholelithiasis 08/06/2020   Thrombocytopenia (HCC) 08/06/2020   Anemia 08/06/2020   AF (paroxysmal atrial fibrillation) (HCC) 08/06/2020   Chronic diastolic CHF (congestive heart failure) (HCC) 08/06/2020   HTN (hypertension) 08/06/2020   OSA (obstructive  sleep apnea) 08/06/2020   Morbid obesity with BMI of 50.0-59.9, adult (HCC) 08/06/2020       Phylliss Blakes, MD [facility], [place]  See AMION to contact appropriate on-call provider

## 2020-08-07 NOTE — Progress Notes (Signed)
Message given to Dr. Doylene Canard that patient would now like to have gall bladder surgery.

## 2020-08-07 NOTE — Progress Notes (Signed)
For some reason, the Epic system timed my consult note from this morning to yesterday morning. Please see my note dated "7/1 at 2:39am" for this mornings note.  Noted patient now agreeable to surgery. Can likely proceed tomorrow pending further evaluation by GI for choledocholithiasis. Hold eliquis, keep NPO after midnight

## 2020-08-07 NOTE — Progress Notes (Signed)
PROGRESS NOTE    Katelyn Preston  VAN:[id number] DOB: 12-31-1965 DOA: 08/06/2020 PCP: Patient, No Pcp Per (Inactive)    Brief Narrative:  This 55 years old female with PMH significant for hypertension, chronic diastolic heart failure, paroxysmal A. fib on Eliquis, obstructive sleep apnea on CPAP, prediabetes and morbid obesity presents in the ED with complaint of abdominal pain and nausea.  She reports intermittent upper right quadrant pain following fast food.  She reports worsening epigastric pain following drinking a mocha yesterday associated with nausea and vomiting.  Ultrasound negative for cholecystitis but had multiple gallstones and intrahepatic ductal dilatation.  MRCP: Cholelithiasis with mild extrahepatic biliary dilatation and suspicion for a small distal CBD stone on some sequences. General surgery is consulted.  Patient wants to have gallbladder removed.  Assessment & Plan:   Active Problems:   Dilation of common bile duct   Symptomatic cholelithiasis   Thrombocytopenia (HCC)   Anemia   AF (paroxysmal atrial fibrillation) (HCC)   Chronic diastolic CHF (congestive heart failure) (HCC)   HTN (hypertension)   OSA (obstructive sleep apnea)   Morbid obesity with BMI of 50.0-59.9, adult (HCC)   Cholelithiasis with CBD and intrahepatic duct dilatation: Patient reports intermittent RUQ pain following fast food. She has worsening of right upper quadrant pain following having Mocha yesterday. Her LFTs are normal. General surgery recommended MRCP prior to any intervention. MRCP shows Cholelithiasis with mild extrahepatic biliary dilatation and suspicion for a small distal CBD stone on some sequences. Patient wants to have gallbladder removed during this hospitalization.  Thrombocytopenia: It seems like it was a lab error 58,   3 weeks ago 247 repeat CBC shows platelet count 199   Normochromic Normocytic Anemia Baseline Hb 10-12. Iron studies, B12 and folic acid normal.    Paroxysmal atrial fibrillation CHA2DS2-VASc score of 3 (gender, CHF, HTN) Hold Eliquis, she may require intervention.  Last dose the evening of 6/30.   Chronic diastolic heart failure Patient appears euvolemic Continue atenolol, Torsemide   Hypertension Continue amlodipine, lisinopril   OSA Continue CPAP   Morbid obesity BMI of greater than 50.  Patient currently being evaluated by general surgery at [facility] for potential gastric bypass   DVT prophylaxis: SCDs Code Status: Full code Family Communication: No family at bedside Disposition Plan:   Status is: Inpatient  Remains inpatient appropriate because:Inpatient level of care appropriate due to severity of illness  Dispo: The patient is from: Home              Anticipated d/c is to: Home              Patient currently is not medically stable to d/c.   Difficult to place patient No  Consultants:  General surgery  Procedures: Abdominal ultrasound and MRCP.  Antimicrobials:   Anti-infectives (From admission, onward)    None        Subjective: Patient was seen and examined at bedside.  She reports feeling better , denies any abdominal pain, nausea, vomiting.   She wants to have her gallbladder removed.  Objective: Vitals:   08/06/20 1957 08/06/20 2256 08/07/20 0535 08/07/20 0935  BP:   115/69 117/71  Pulse:  (!) 54 (!) 53 (!) 50  Resp:  18 16 16   Temp:   97.7 F (36.5 C) 98 F (36.7 C)  TempSrc:   Axillary Oral  SpO2:  100% 100%   Weight: (!) 161.7 kg     Height: 5\' 8"  (1.727 m)  Intake/Output Summary (Last 24 hours) at 08/07/2020 1134 Last data filed at 08/07/2020 1000 Gross per 24 hour  Intake --  Output 0 ml  Net 0 ml   Filed Weights   08/06/20 1532 08/06/20 1957  Weight: (!) 161 kg (!) 161.7 kg    Examination:  General exam: Appears calm and comfortable, not in any acute distress. Respiratory system: Clear to auscultation. Respiratory effort normal. Cardiovascular system: S1 & S2 heard,  RRR. No JVD, murmurs, rubs, gallops or clicks. No pedal edema. Gastrointestinal system: Abdomen is nondistended, soft and nontender. No organomegaly or masses felt. Normal bowel sounds heard. Central nervous system: Alert and oriented. No focal neurological deficits. Extremities: Symmetric 5 x 5 power.  No edema, no cyanosis, no clubbing. Skin: No rashes, lesions or ulcers Psychiatry: Judgement and insight appear normal. Mood & affect appropriate.     Data Reviewed: I have personally reviewed following labs and imaging studies  CBC: Recent Labs  Lab 08/06/20 1555 08/07/20 0438  WBC 6.2 6.8  NEUTROABS 4.5  --   HGB 10.0* 11.2*  HCT 31.4* 35.0*  MCV 95.4 94.6  PLT 58* 199   Basic Metabolic Panel: Recent Labs  Lab 08/06/20 1555 08/07/20 0438  NA 142 141  K 4.3 3.7  CL 108 105  CO2 29 28  GLUCOSE 117* 105*  BUN 12 9  CREATININE 0.72 0.81  CALCIUM 9.3 9.0   GFR: Estimated Creatinine Clearance: 129.1 mL/min (by C-G formula based on SCr of 0.81 mg/dL). Liver Function Tests: Recent Labs  Lab 08/06/20 1555 08/07/20 0438  AST 24 16  ALT 15 15  ALKPHOS 92 87  BILITOT 0.9 0.8  PROT 7.8 7.1  ALBUMIN 4.2 3.7   Recent Labs  Lab 08/06/20 1555  LIPASE 35   No results for input(s): AMMONIA in the last 168 hours. Coagulation Profile: No results for input(s): INR, PROTIME in the last 168 hours. Cardiac Enzymes: No results for input(s): CKTOTAL, CKMB, CKMBINDEX, TROPONINI in the last 168 hours. BNP (last 3 results) No results for input(s): PROBNP in the last 8760 hours. HbA1C: No results for input(s): HGBA1C in the last 72 hours. CBG: No results for input(s): GLUCAP in the last 168 hours. Lipid Profile: No results for input(s): CHOL, HDL, LDLCALC, TRIG, CHOLHDL, LDLDIRECT in the last 72 hours. Thyroid Function Tests: No results for input(s): TSH, T4TOTAL, FREET4, T3FREE, THYROIDAB in the last 72 hours. Anemia Panel: Recent Labs    08/06/20 1813  VITAMINB12 324   FOLATE 9.2  TIBC 330  IRON 31   Sepsis Labs: No results for input(s): PROCALCITON, LATICACIDVEN in the last 168 hours.  Recent Results (from the past 240 hour(s))  Resp Panel by RT-PCR (Flu A&B, Covid) Nasopharyngeal Swab     Status: None   Collection Time: 08/06/20  6:13 PM   Specimen: Nasopharyngeal Swab; Nasopharyngeal(NP) swabs in vial transport medium  Result Value Ref Range Status   SARS Coronavirus 2 by RT PCR NEGATIVE NEGATIVE Final    Comment: (NOTE) SARS-CoV-2 target nucleic acids are NOT DETECTED.  The SARS-CoV-2 RNA is generally detectable in upper respiratory specimens during the acute phase of infection. The lowest concentration of SARS-CoV-2 viral copies this assay can detect is 138 copies/mL. A negative result does not preclude SARS-Cov-2 infection and should not be used as the sole basis for treatment or other patient management decisions. A negative result may occur with  improper specimen collection/handling, submission of specimen other than nasopharyngeal swab, presence of viral mutation(s) within  the areas targeted by this assay, and inadequate number of viral copies(<138 copies/mL). A negative result must be combined with clinical observations, patient history, and epidemiological information. The expected result is Negative.  Fact Sheet for Patients:  BloggerCourse.com  Fact Sheet for Healthcare Providers:  SeriousBroker.it  This test is no t yet approved or cleared by the [place] FDA and  has been authorized for detection and/or diagnosis of SARS-CoV-2 by FDA under an Emergency Use Authorization (EUA). This EUA will remain  in effect (meaning this test can be used) for the duration of the COVID-19 declaration under Section 564(b)(1) of the Act, 21 U.S.C.section 360bbb-3(b)(1), unless the authorization is terminated  or revoked sooner.       Influenza A by PCR NEGATIVE NEGATIVE Final    Influenza B by PCR NEGATIVE NEGATIVE Final    Comment: (NOTE) The Xpert Xpress SARS-CoV-2/FLU/RSV plus assay is intended as an aid in the diagnosis of influenza from Nasopharyngeal swab specimens and should not be used as a sole basis for treatment. Nasal washings and aspirates are unacceptable for Xpert Xpress SARS-CoV-2/FLU/RSV testing.  Fact Sheet for Patients: BloggerCourse.com  Fact Sheet for Healthcare Providers: SeriousBroker.it  This test is not yet approved or cleared by the [place] FDA and has been authorized for detection and/or diagnosis of SARS-CoV-2 by FDA under an Emergency Use Authorization (EUA). This EUA will remain in effect (meaning this test can be used) for the duration of the COVID-19 declaration under Section 564(b)(1) of the Act, 21 U.S.C. section 360bbb-3(b)(1), unless the authorization is terminated or revoked.  Performed at [facility], [address], [place], [place] [address]     Radiology Studies: DG Chest 2 View  Result Date: 08/06/2020 CLINICAL DATA:  Chest pain EXAM: CHEST - 2 VIEW COMPARISON:  11/13/2017 FINDINGS: Mild cardiomegaly. No focal opacity or pleural effusion. No pneumothorax. IMPRESSION: No active cardiopulmonary disease.  Cardiomegaly Electronically Signed   By: Jasmine Pang M.D.   On: 08/06/2020 17:49   MR ABDOMEN MRCP W WO CONTAST  Result Date: 08/06/2020 CLINICAL DATA:  Right upper quadrant abdominal pain. Mild biliary dilatation. Gallstones. EXAM: MRI ABDOMEN WITHOUT AND WITH CONTRAST (INCLUDING MRCP) TECHNIQUE: Multiplanar multisequence MR imaging of the abdomen was performed both before and after the administration of intravenous contrast. Heavily T2-weighted images of the biliary and pancreatic ducts were obtained, and three-dimensional MRCP images were rendered by post processing. CONTRAST:  70mL GADAVIST GADOBUTROL 1 MMOL/ML IV SOLN COMPARISON:   08/06/2020 FINDINGS: Despite efforts by the technologist and patient, motion artifact is present on today's exam and could not be eliminated. This reduces exam sensitivity and specificity. Body habitus reduces diagnostic sensitivity and specificity. Lower chest: Cardiomegaly. Hepatobiliary: 2.8 cm hemangioma in the left hepatic lobe as shown on image 22 series 34. Several large gallstones are present in the gallbladder measuring up to 2.7 cm in diameter. The common hepatic duct measures 0.7 cm in diameter. The common bile duct measures 0.6 cm in diameter, mildly dilated. On image 7 of series 8, I suspect a small distal CBD stone, also suggested on 24-25 of series 3. Admittedly this is less apparent on other sequences. No abnormal enhancement along the biliary tree. No significant intrahepatic biliary dilatation. Pancreas:  Pancreas divisum. Spleen:  Unremarkable Adrenals/Urinary Tract:  Unremarkable Stomach/Bowel: Unremarkable Vascular/Lymphatic:  Unremarkable Other:  No supplemental non-categorized findings. Musculoskeletal: Lower lumbar degenerative disc disease and degenerative endplate findings. IMPRESSION: 1. Cholelithiasis with mild extrahepatic biliary dilatation and suspicion for a small  distal CBD stone on some sequences. Diagnostic accuracy is adversely affected by motion artifact and patient body habitus. 2. 2.8 cm hemangioma in the left hepatic lobe. 3. Cardiomegaly. 4. Pancreas divisum. 5. Lower lumbar degenerative disc disease. Electronically Signed   By: Gaylyn Rong M.D.   On: 08/06/2020 22:01   US Abdomen Limited RUQ (LIVER/GB)  Result Date: 08/06/2020 CLINICAL DATA:  Right upper quadrant pain EXAM: ULTRASOUND ABDOMEN LIMITED RIGHT UPPER QUADRANT COMPARISON:  None. FINDINGS: Gallbladder: Partially distended with multiple gallstones within. No pericholecystic fluid is noted. Negative sonographic Murphy's sign is elicited. Common bile duct: Diameter: 9.4 mm. Liver: Increased in echogenicity  consistent with fatty infiltration. Intrahepatic biliary ductal dilatation is noted as well. Portal vein is patent on color Doppler imaging with normal direction of blood flow towards the liver. Other: None. IMPRESSION: Multiple gallstones within a partially distended gallbladder. Dilated common bile duct 9.4 mm with intrahepatic ductal dilatation. This raises suspicion for distal common bile duct stone. Electronically Signed   By: Alcide Clever M.D.   On: 08/06/2020 16:24    Scheduled Meds:  amLODipine  10 mg Oral Daily   atenolol  50 mg Oral Daily   lisinopril  40 mg Oral Daily   magnesium oxide  400 mg Oral Daily   potassium chloride  20 mEq Oral Daily   torsemide  20 mg Oral QODAY   Continuous Infusions:   LOS: 1 day    Time spent: 35 mins    Ellenora Talton, MD [facility]   If 7PM-7AM, please contact night-coverage

## 2020-08-07 NOTE — Consult Note (Signed)
Reason for Consult: Questionable CBD stones Referring Physician: Surgical team  Abbigale Lorson is an 55 y.o. female.  HPI: Patient seen and examined in hospital computer chart reviewed and case discussed with the surgical team and she is currently feeling better and has had episodic pain for about a year worse with fried and spicy food and she has not seen a gastroenterologist in the past but does have an upcoming colonoscopy at [facility] later this month which as an aside may need to be pushed back based on her postop condition and her family history is negative from a gallbladder standpoint and she has no other complaints  Past Medical History:  Diagnosis Date   A-fib (HCC)    CHF (congestive heart failure) (HCC)    Hypertension     Past Surgical History:  Procedure Laterality Date   BACK SURGERY     CESAREAN SECTION      History reviewed. No pertinent family history.  Social History:  reports that she has never smoked. She has never used smokeless tobacco. She reports that she does not drink alcohol and does not use drugs.  Allergies:  Allergies  Allergen Reactions   Avocado Nausea And Vomiting   Eggs Or Egg-Derived Products Nausea And Vomiting    Medications: I have reviewed the patient's current medications.  Results for orders placed or performed during the hospital encounter of 08/06/20 (from the past 48 hour(s))  Pregnancy, urine     Status: None   Collection Time: 08/06/20  3:30 PM  Result Value Ref Range   Preg Test, Ur NEGATIVE NEGATIVE    Comment:        THE SENSITIVITY OF THIS METHODOLOGY IS >20 mIU/mL. Performed at [facility], [address], [place], [place] [address]   Comprehensive metabolic panel     Status: Abnormal   Collection Time: 08/06/20  3:55 PM  Result Value Ref Range   Sodium 142 135 - 145 mmol/L   Potassium 4.3 3.5 - 5.1 mmol/L   Chloride 108 98 - 111 mmol/L   CO2 29 22 - 32 mmol/L   Glucose, Bld 117 (H) 70 - 99 mg/dL     Comment: Glucose reference range applies only to samples taken after fasting for at least 8 hours.   BUN 12 6 - 20 mg/dL   Creatinine, Ser 1.69 0.44 - 1.00 mg/dL   Calcium 9.3 8.9 - 67.8 mg/dL   Total Protein 7.8 6.5 - 8.1 g/dL   Albumin 4.2 3.5 - 5.0 g/dL   AST 24 15 - 41 U/L   ALT 15 0 - 44 U/L   Alkaline Phosphatase 92 38 - 126 U/L   Total Bilirubin 0.9 0.3 - 1.2 mg/dL   GFR, Estimated >93 >81 mL/min    Comment: (NOTE) Calculated using the CKD-EPI Creatinine Equation (2021)    Anion gap 5 5 - 15    Comment: Performed at [facility], [address], [place], [place] [address]  Lipase, blood     Status: None   Collection Time: 08/06/20  3:55 PM  Result Value Ref Range   Lipase 35 11 - 51 U/L    Comment: Performed at [facility], [address] [place], [place] [address]  CBC with Differential     Status: Abnormal   Collection Time: 08/06/20  3:55 PM  Result Value Ref Range   WBC 6.2 4.0 - 10.5 K/uL   RBC 3.29 (L) 3.87 - 5.11 MIL/uL  Hemoglobin 10.0 (L) 12.0 - 15.0 g/dL   HCT 06.2 (L) 37.6 - 28.3 %   MCV 95.4 80.0 - 100.0 fL   MCH 30.4 26.0 - 34.0 pg   MCHC 31.8 30.0 - 36.0 g/dL   RDW 15.1 76.1 - 60.7 %   Platelets 58 (L) 150 - 400 K/uL    Comment: Immature Platelet Fraction may be clinically indicated, consider ordering this additional test [id number]    nRBC 0.0 0.0 - 0.2 %   Neutrophils Relative % 73 %   Neutro Abs 4.5 1.7 - 7.7 K/uL   Lymphocytes Relative 20 %   Lymphs Abs 1.2 0.7 - 4.0 K/uL   Monocytes Relative 4 %   Monocytes Absolute 0.3 0.1 - 1.0 K/uL   Eosinophils Relative 2 %   Eosinophils Absolute 0.1 0.0 - 0.5 K/uL   Basophils Relative 0 %   Basophils Absolute 0.0 0.0 - 0.1 K/uL   Immature Granulocytes 1 %   Abs Immature Granulocytes 0.03 0.00 - 0.07 K/uL    Comment: Performed at [facility], [address], [place], [place] [address]  Troponin I (High Sensitivity)     Status: None    Collection Time: 08/06/20  3:55 PM  Result Value Ref Range   Troponin I (High Sensitivity) 4 <18 ng/L    Comment: (NOTE) Elevated high sensitivity troponin I (hsTnI) values and significant  changes across serial measurements may suggest ACS but many other  chronic and acute conditions are known to elevate hsTnI results.  Refer to the "Links" section for chest pain algorithms and additional  guidance. Performed at [facility], [address] [place], [place] [address]   Troponin I (High Sensitivity)     Status: None   Collection Time: 08/06/20  6:13 PM  Result Value Ref Range   Troponin I (High Sensitivity) 4 <18 ng/L    Comment: (NOTE) Elevated high sensitivity troponin I (hsTnI) values and significant  changes across serial measurements may suggest ACS but many other  chronic and acute conditions are known to elevate hsTnI results.  Refer to the "Links" section for chest pain algorithms and additional  guidance. Performed at [facility], [address], [place], [place] [address]   Resp Panel by RT-PCR (Flu A&B, Covid) Nasopharyngeal Swab     Status: None   Collection Time: 08/06/20  6:13 PM   Specimen: Nasopharyngeal Swab; Nasopharyngeal(NP) swabs in vial transport medium  Result Value Ref Range   SARS Coronavirus 2 by RT PCR NEGATIVE NEGATIVE    Comment: (NOTE) SARS-CoV-2 target nucleic acids are NOT DETECTED.  The SARS-CoV-2 RNA is generally detectable in upper respiratory specimens during the acute phase of infection. The lowest concentration of SARS-CoV-2 viral copies this assay can detect is 138 copies/mL. A negative result does not preclude SARS-Cov-2 infection and should not be used as the sole basis for treatment or other patient management decisions. A negative result may occur with  improper specimen collection/handling, submission of specimen other than nasopharyngeal swab, presence of viral mutation(s) within the areas  targeted by this assay, and inadequate number of viral copies(<138 copies/mL). A negative result must be combined with clinical observations, patient history, and epidemiological information. The expected result is Negative.  Fact Sheet for Patients:  BloggerCourse.com  Fact Sheet for Healthcare Providers:  SeriousBroker.it  This test is no t yet approved or cleared by the [place] FDA and  has been authorized for detection and/or diagnosis of SARS-CoV-2  by FDA under an Emergency Use Authorization (EUA). This EUA will remain  in effect (meaning this test can be used) for the duration of the COVID-19 declaration under Section 564(b)(1) of the Act, 21 U.S.C.section 360bbb-3(b)(1), unless the authorization is terminated  or revoked sooner.       Influenza A by PCR NEGATIVE NEGATIVE   Influenza B by PCR NEGATIVE NEGATIVE    Comment: (NOTE) The Xpert Xpress SARS-CoV-2/FLU/RSV plus assay is intended as an aid in the diagnosis of influenza from Nasopharyngeal swab specimens and should not be used as a sole basis for treatment. Nasal washings and aspirates are unacceptable for Xpert Xpress SARS-CoV-2/FLU/RSV testing.  Fact Sheet for Patients: BloggerCourse.com  Fact Sheet for Healthcare Providers: SeriousBroker.it  This test is not yet approved or cleared by the [place] FDA and has been authorized for detection and/or diagnosis of SARS-CoV-2 by FDA under an Emergency Use Authorization (EUA). This EUA will remain in effect (meaning this test can be used) for the duration of the COVID-19 declaration under Section 564(b)(1) of the Act, 21 U.S.C. section 360bbb-3(b)(1), unless the authorization is terminated or revoked.  Performed at [facility], [address] [place], [place] [address]   Vitamin B12     Status: None   Collection Time: 08/06/20  6:13  PM  Result Value Ref Range   Vitamin B-12 324 180 - 914 pg/mL    Comment: (NOTE) This assay is not validated for testing neonatal or myeloproliferative syndrome specimens for Vitamin B12 levels. Performed at [facility], [address] [place], [place] [address]   Iron and TIBC     Status: Abnormal   Collection Time: 08/06/20  6:13 PM  Result Value Ref Range   Iron 31 28 - 170 ug/dL   TIBC 098 119 - 147 ug/dL   Saturation Ratios 9 (L) 10.4 - 31.8 %   UIBC 299 ug/dL    Comment: Performed at [facility], [address], [place], [place] [address]  Folate, serum, performed at [facility] lab     Status: None   Collection Time: 08/06/20  6:13 PM  Result Value Ref Range   Folate 9.2 >5.9 ng/mL    Comment: Performed at [facility], [address] [place], [place] [address]  CBC     Status: Abnormal   Collection Time: 08/07/20  4:38 AM  Result Value Ref Range   WBC 6.8 4.0 - 10.5 K/uL   RBC 3.70 (L) 3.87 - 5.11 MIL/uL   Hemoglobin 11.2 (L) 12.0 - 15.0 g/dL   HCT 65.7 (L) 84.6 - 96.2 %   MCV 94.6 80.0 - 100.0 fL   MCH 30.3 26.0 - 34.0 pg   MCHC 32.0 30.0 - 36.0 g/dL   RDW 95.2 84.1 - 32.4 %   Platelets 199 150 - 400 K/uL    Comment: DELTA CHECK NOTED   nRBC 0.0 0.0 - 0.2 %    Comment: Performed at [facility], [address] [place], [place] [address]  Comprehensive metabolic panel     Status: Abnormal   Collection Time: 08/07/20  4:38 AM  Result Value Ref Range   Sodium 141 135 - 145 mmol/L   Potassium 3.7 3.5 - 5.1 mmol/L   Chloride 105 98 - 111 mmol/L   CO2 28 22 - 32 mmol/L   Glucose, Bld 105 (H) 70 - 99 mg/dL    Comment: Glucose reference range applies only to  samples taken after fasting for at least 8 hours.   BUN 9 6 - 20 mg/dL   Creatinine, Ser 5.46 0.44 - 1.00 mg/dL   Calcium 9.0 8.9 - 50.3 mg/dL   Total Protein 7.1 6.5 - 8.1 g/dL   Albumin 3.7 3.5 - 5.0 g/dL   AST 16 15 - 41 U/L    ALT 15 0 - 44 U/L   Alkaline Phosphatase 87 38 - 126 U/L   Total Bilirubin 0.8 0.3 - 1.2 mg/dL   GFR, Estimated >54 >65 mL/min    Comment: (NOTE) Calculated using the CKD-EPI Creatinine Equation (2021)    Anion gap 8 5 - 15    Comment: Performed at [facility], [address], [place], [place] [address]    DG Chest 2 View  Result Date: 08/06/2020 CLINICAL DATA:  Chest pain EXAM: CHEST - 2 VIEW COMPARISON:  11/13/2017 FINDINGS: Mild cardiomegaly. No focal opacity or pleural effusion. No pneumothorax. IMPRESSION: No active cardiopulmonary disease.  Cardiomegaly Electronically Signed   By: Jasmine Pang M.D.   On: 08/06/2020 17:49   MR ABDOMEN MRCP W WO CONTAST  Result Date: 08/06/2020 CLINICAL DATA:  Right upper quadrant abdominal pain. Mild biliary dilatation. Gallstones. EXAM: MRI ABDOMEN WITHOUT AND WITH CONTRAST (INCLUDING MRCP) TECHNIQUE: Multiplanar multisequence MR imaging of the abdomen was performed both before and after the administration of intravenous contrast. Heavily T2-weighted images of the biliary and pancreatic ducts were obtained, and three-dimensional MRCP images were rendered by post processing. CONTRAST:  89mL GADAVIST GADOBUTROL 1 MMOL/ML IV SOLN COMPARISON:  08/06/2020 FINDINGS: Despite efforts by the technologist and patient, motion artifact is present on today's exam and could not be eliminated. This reduces exam sensitivity and specificity. Body habitus reduces diagnostic sensitivity and specificity. Lower chest: Cardiomegaly. Hepatobiliary: 2.8 cm hemangioma in the left hepatic lobe as shown on image 22 series 34. Several large gallstones are present in the gallbladder measuring up to 2.7 cm in diameter. The common hepatic duct measures 0.7 cm in diameter. The common bile duct measures 0.6 cm in diameter, mildly dilated. On image 7 of series 8, I suspect a small distal CBD stone, also suggested on 24-25 of series 3. Admittedly this is less apparent on  other sequences. No abnormal enhancement along the biliary tree. No significant intrahepatic biliary dilatation. Pancreas:  Pancreas divisum. Spleen:  Unremarkable Adrenals/Urinary Tract:  Unremarkable Stomach/Bowel: Unremarkable Vascular/Lymphatic:  Unremarkable Other:  No supplemental non-categorized findings. Musculoskeletal: Lower lumbar degenerative disc disease and degenerative endplate findings. IMPRESSION: 1. Cholelithiasis with mild extrahepatic biliary dilatation and suspicion for a small distal CBD stone on some sequences. Diagnostic accuracy is adversely affected by motion artifact and patient body habitus. 2. 2.8 cm hemangioma in the left hepatic lobe. 3. Cardiomegaly. 4. Pancreas divisum. 5. Lower lumbar degenerative disc disease. Electronically Signed   By: Gaylyn Rong M.D.   On: 08/06/2020 22:01   US Abdomen Limited RUQ (LIVER/GB)  Result Date: 08/06/2020 CLINICAL DATA:  Right upper quadrant pain EXAM: ULTRASOUND ABDOMEN LIMITED RIGHT UPPER QUADRANT COMPARISON:  None. FINDINGS: Gallbladder: Partially distended with multiple gallstones within. No pericholecystic fluid is noted. Negative sonographic Murphy's sign is elicited. Common bile duct: Diameter: 9.4 mm. Liver: Increased in echogenicity consistent with fatty infiltration. Intrahepatic biliary ductal dilatation is noted as well. Portal vein is patent on color Doppler imaging with normal direction of blood flow towards the liver. Other: None. IMPRESSION: Multiple gallstones within a partially distended gallbladder. Dilated common bile duct 9.4 mm with intrahepatic  ductal dilatation. This raises suspicion for distal common bile duct stone. Electronically Signed   By: Alcide CleverMark  Lukens M.D.   On: 08/06/2020 16:24    Review of Systems negative except above Blood pressure (!) 101/56, pulse 60, temperature 98 F (36.7 C), temperature source Oral, resp. rate 16, height 5\' 8"  (1.727 m), weight (!) 161.7 kg, SpO2 99 %. Physical Exam vital signs  stable afebrile no acute distress exam pertinent for her abdomen being soft nontender currently liver test normal white count normal ultrasound and MRI reviewed I do not see an obvious stone on my review of the MRI  Assessment/Plan: Questionable CBD stones Plan: The risk benefits methods and success rate of ERCP and stone extraction was discussed with the patient and after my conversation with the surgical team they will proceed with cholecystectomy and hopefully Intra-Op cholangiogram and then if positive for CBD stones I am happy to proceed with ERCP during this hospital stay but if Intra-Op cholangiogram negative then we will hold off and see for the extra risks and costs of that procedure and please call me if I could be of any further assistance with this hospital stay otherwise we will wait on surgical findings as above  Leavy Heatherly E 08/07/2020, 2:05 PM

## 2020-08-08 ENCOUNTER — Inpatient Hospital Stay (HOSPITAL_COMMUNITY): Payer: BC Managed Care – PPO

## 2020-08-08 ENCOUNTER — Encounter (HOSPITAL_COMMUNITY)
Admission: EM | Disposition: A | Discharge status: Home / Self Care | Payer: Self-pay | Source: Home / Self Care | Attending: Family Medicine

## 2020-08-08 ENCOUNTER — Inpatient Hospital Stay (HOSPITAL_COMMUNITY): Payer: BC Managed Care – PPO | Admitting: Certified Registered"

## 2020-08-08 DIAGNOSIS — K8020 Calculus of gallbladder without cholecystitis without obstruction: Secondary | ICD-10-CM

## 2020-08-08 HISTORY — PX: CHOLECYSTECTOMY: SHX55

## 2020-08-08 LAB — CBC
HCT: 34.4 % — ABNORMAL LOW (ref 36.0–46.0)
Hemoglobin: 10.9 g/dL — ABNORMAL LOW (ref 12.0–15.0)
MCH: 29.9 pg (ref 26.0–34.0)
MCHC: 31.7 g/dL (ref 30.0–36.0)
MCV: 94.5 fL (ref 80.0–100.0)
Platelets: 202 10*3/uL (ref 150–400)
RBC: 3.64 MIL/uL — ABNORMAL LOW (ref 3.87–5.11)
RDW: 13.9 % (ref 11.5–15.5)
WBC: 6.3 10*3/uL (ref 4.0–10.5)
nRBC: 0 % (ref 0.0–0.2)

## 2020-08-08 LAB — COMPREHENSIVE METABOLIC PANEL
ALT: 14 U/L (ref 0–44)
AST: 15 U/L (ref 15–41)
Albumin: 3.6 g/dL (ref 3.5–5.0)
Alkaline Phosphatase: 82 U/L (ref 38–126)
Anion gap: 7 (ref 5–15)
BUN: 8 mg/dL (ref 6–20)
CO2: 29 mmol/L (ref 22–32)
Calcium: 9 mg/dL (ref 8.9–10.3)
Chloride: 104 mmol/L (ref 98–111)
Creatinine, Ser: 0.79 mg/dL (ref 0.44–1.00)
GFR, Estimated: >60 mL/min (ref >60)
Glucose, Bld: 103 mg/dL — ABNORMAL HIGH (ref 70–99)
Potassium: 3.6 mmol/L (ref 3.5–5.1)
Sodium: 140 mmol/L (ref 135–145)
Total Bilirubin: 1 mg/dL (ref 0.3–1.2)
Total Protein: 6.8 g/dL (ref 6.5–8.1)

## 2020-08-08 SURGERY — LAPAROSCOPIC CHOLECYSTECTOMY WITH INTRAOPERATIVE CHOLANGIOGRAM
Anesthesia: General | Site: Abdomen

## 2020-08-08 MED ORDER — FENTANYL CITRATE (PF) 100 MCG/2ML IJ SOLN
INTRAMUSCULAR | Status: AC
  Filled 2020-08-08: qty 2

## 2020-08-08 MED ORDER — EPHEDRINE 5 MG/ML INJ
INTRAVENOUS | Status: AC
  Filled 2020-08-08: qty 10

## 2020-08-08 MED ORDER — ONDANSETRON HCL 4 MG/2ML IJ SOLN: 4.0000 mg | Freq: Once | INTRAMUSCULAR | Status: DC | PRN

## 2020-08-08 MED ORDER — LACTATED RINGERS IV SOLN
INTRAVENOUS | Status: AC | PRN
  Administered 2020-08-08: 1000 mL

## 2020-08-08 MED ORDER — OXYCODONE HCL 5 MG PO TABS: 5.0000 mg | ORAL_TABLET | Freq: Three times a day (TID) | ORAL | 0 refills | Status: AC | PRN

## 2020-08-08 MED ORDER — SUCCINYLCHOLINE CHLORIDE 200 MG/10ML IV SOSY
PREFILLED_SYRINGE | INTRAVENOUS | Status: DC | PRN
  Administered 2020-08-08: 160 mg via INTRAVENOUS

## 2020-08-08 MED ORDER — PHENYLEPHRINE HCL (PRESSORS) 10 MG/ML IV SOLN
INTRAVENOUS | Status: AC
  Filled 2020-08-08: qty 1

## 2020-08-08 MED ORDER — DEXAMETHASONE SODIUM PHOSPHATE 10 MG/ML IJ SOLN
INTRAMUSCULAR | Status: AC
  Filled 2020-08-08: qty 1

## 2020-08-08 MED ORDER — BUPIVACAINE-EPINEPHRINE 0.25% -1:200000 IJ SOLN
INTRAMUSCULAR | Status: DC | PRN
  Administered 2020-08-08: 30 mL

## 2020-08-08 MED ORDER — SUGAMMADEX SODIUM 500 MG/5ML IV SOLN
INTRAVENOUS | Status: DC | PRN
  Administered 2020-08-08: 500 mg via INTRAVENOUS

## 2020-08-08 MED ORDER — ONDANSETRON HCL 4 MG/2ML IJ SOLN
INTRAMUSCULAR | Status: DC | PRN
  Administered 2020-08-08: 4 mg via INTRAVENOUS

## 2020-08-08 MED ORDER — PROPOFOL 10 MG/ML IV BOLUS
INTRAVENOUS | Status: AC
  Filled 2020-08-08: qty 20

## 2020-08-08 MED ORDER — AMISULPRIDE (ANTIEMETIC) 5 MG/2ML IV SOLN
INTRAVENOUS | Status: AC
  Filled 2020-08-08: qty 4

## 2020-08-08 MED ORDER — SUGAMMADEX SODIUM 500 MG/5ML IV SOLN
INTRAVENOUS | Status: AC
  Filled 2020-08-08: qty 5

## 2020-08-08 MED ORDER — ONDANSETRON HCL 4 MG/2ML IJ SOLN
INTRAMUSCULAR | Status: AC
  Filled 2020-08-08: qty 2

## 2020-08-08 MED ORDER — BUPIVACAINE-EPINEPHRINE (PF) 0.25% -1:200000 IJ SOLN
INTRAMUSCULAR | Status: AC
  Filled 2020-08-08: qty 30

## 2020-08-08 MED ORDER — DEXAMETHASONE SODIUM PHOSPHATE 10 MG/ML IJ SOLN
INTRAMUSCULAR | Status: DC | PRN
  Administered 2020-08-08: 10 mg via INTRAVENOUS

## 2020-08-08 MED ORDER — FENTANYL CITRATE (PF) 250 MCG/5ML IJ SOLN
INTRAMUSCULAR | Status: DC | PRN
  Administered 2020-08-08 (×2): 25 ug via INTRAVENOUS
  Administered 2020-08-08 (×3): 50 ug via INTRAVENOUS

## 2020-08-08 MED ORDER — MIDAZOLAM HCL 2 MG/2ML IJ SOLN
INTRAMUSCULAR | Status: DC | PRN
  Administered 2020-08-08: 1 mg via INTRAVENOUS

## 2020-08-08 MED ORDER — HYDROMORPHONE HCL 1 MG/ML IJ SOLN: 0.5000 mg | INTRAMUSCULAR | Status: DC | PRN

## 2020-08-08 MED ORDER — OXYCODONE HCL 5 MG PO TABS
5.0000 mg | ORAL_TABLET | Freq: Once | ORAL | Status: AC | PRN
  Administered 2020-08-08: 5 mg via ORAL

## 2020-08-08 MED ORDER — FENTANYL CITRATE (PF) 250 MCG/5ML IJ SOLN
INTRAMUSCULAR | Status: AC
  Filled 2020-08-08: qty 5

## 2020-08-08 MED ORDER — AMISULPRIDE (ANTIEMETIC) 5 MG/2ML IV SOLN
10.0000 mg | Freq: Once | INTRAVENOUS | Status: AC | PRN
  Administered 2020-08-08: 10 mg via INTRAVENOUS

## 2020-08-08 MED ORDER — ROCURONIUM BROMIDE 10 MG/ML (PF) SYRINGE
PREFILLED_SYRINGE | INTRAVENOUS | Status: DC | PRN
  Administered 2020-08-08: 60 mg via INTRAVENOUS

## 2020-08-08 MED ORDER — OXYCODONE HCL 5 MG PO TABS
ORAL_TABLET | ORAL | Status: AC
  Filled 2020-08-08: qty 1

## 2020-08-08 MED ORDER — TRAMADOL HCL 50 MG PO TABS
50.0000 mg | ORAL_TABLET | Freq: Four times a day (QID) | ORAL | Status: DC | PRN
  Administered 2020-08-08 (×2): 50 mg via ORAL
  Filled 2020-08-08 (×2): qty 1

## 2020-08-08 MED ORDER — FENTANYL CITRATE (PF) 100 MCG/2ML IJ SOLN
25.0000 ug | INTRAMUSCULAR | Status: DC | PRN
  Administered 2020-08-08 (×3): 50 ug via INTRAVENOUS

## 2020-08-08 MED ORDER — MIDAZOLAM HCL 2 MG/2ML IJ SOLN
INTRAMUSCULAR | Status: AC
  Filled 2020-08-08: qty 2

## 2020-08-08 MED ORDER — CEFAZOLIN SODIUM-DEXTROSE 2-4 GM/100ML-% IV SOLN
INTRAVENOUS | Status: AC
  Filled 2020-08-08: qty 100

## 2020-08-08 MED ORDER — SENNOSIDES-DOCUSATE SODIUM 8.6-50 MG PO TABS
1.0000 | ORAL_TABLET | Freq: Every morning | ORAL | Status: DC
  Administered 2020-08-08 – 2020-08-09 (×2): 1 via ORAL
  Filled 2020-08-08 (×2): qty 1

## 2020-08-08 MED ORDER — LACTATED RINGERS IV SOLN: INTRAVENOUS | Status: DC | PRN

## 2020-08-08 MED ORDER — LIDOCAINE 2% (20 MG/ML) 5 ML SYRINGE
INTRAMUSCULAR | Status: DC | PRN
  Administered 2020-08-08: 100 mg via INTRAVENOUS

## 2020-08-08 MED ORDER — SODIUM CHLORIDE (PF) 0.9 % IJ SOLN
INTRAMUSCULAR | Status: DC | PRN
  Administered 2020-08-08: 15 mL

## 2020-08-08 MED ORDER — PROPOFOL 10 MG/ML IV BOLUS
INTRAVENOUS | Status: DC | PRN
  Administered 2020-08-08: 200 mg via INTRAVENOUS

## 2020-08-08 MED ORDER — OXYCODONE HCL 5 MG/5ML PO SOLN: 5.0000 mg | Freq: Once | ORAL | Status: AC | PRN

## 2020-08-08 MED ORDER — PHENYLEPHRINE 40 MCG/ML (10ML) SYRINGE FOR IV PUSH (FOR BLOOD PRESSURE SUPPORT)
PREFILLED_SYRINGE | INTRAVENOUS | Status: AC
  Filled 2020-08-08: qty 20

## 2020-08-08 MED ORDER — HYDROMORPHONE HCL 1 MG/ML IJ SOLN
0.5000 mg | INTRAMUSCULAR | Status: DC | PRN
Start: 2020-08-08 — End: 2020-08-09
  Administered 2020-08-08 (×2): 1 mg via INTRAVENOUS
  Filled 2020-08-08 (×2): qty 1

## 2020-08-08 MED ORDER — DOCUSATE SODIUM 100 MG PO CAPS: 100.0000 mg | ORAL_CAPSULE | Freq: Two times a day (BID) | ORAL | 0 refills | Status: AC

## 2020-08-08 SURGICAL SUPPLY — 35 items
APPLIER CLIP ROT 10 11.4 M/L (STAPLE) ×2
BAG COUNTER SPONGE SURGICOUNT (BAG) IMPLANT
CABLE HIGH FREQUENCY MONO STRZ (ELECTRODE) ×2 IMPLANT
CHLORAPREP W/TINT 26 (MISCELLANEOUS) ×2 IMPLANT
CLIP APPLIE ROT 10 11.4 M/L (STAPLE) ×1 IMPLANT
COVER MAYO STAND STRL (DRAPES) IMPLANT
COVER SURGICAL LIGHT HANDLE (MISCELLANEOUS) ×2 IMPLANT
DECANTER SPIKE VIAL GLASS SM (MISCELLANEOUS) ×2 IMPLANT
DERMABOND ADVANCED (GAUZE/BANDAGES/DRESSINGS) ×1
DERMABOND ADVANCED .7 DNX12 (GAUZE/BANDAGES/DRESSINGS) ×1 IMPLANT
DRAPE C-ARM 42X120 X-RAY (DRAPES) ×2 IMPLANT
ELECT REM PT RETURN 15FT ADLT (MISCELLANEOUS) ×2 IMPLANT
GLOVE SURG ENC MOIS LTX SZ6 (GLOVE) ×2 IMPLANT
GLOVE SURG MICRO LTX SZ6 (GLOVE) ×2 IMPLANT
GLOVE SURG UNDER LTX SZ6.5 (GLOVE) ×2 IMPLANT
GOWN STRL REUS W/TWL LRG LVL3 (GOWN DISPOSABLE) ×2 IMPLANT
GOWN STRL REUS W/TWL XL LVL3 (GOWN DISPOSABLE) ×4 IMPLANT
GRASPER SUT TROCAR 14GX15 (MISCELLANEOUS) IMPLANT
HEMOSTAT SNOW SURGICEL 2X4 (HEMOSTASIS) ×2 IMPLANT
KIT BASIN OR (CUSTOM PROCEDURE TRAY) ×2 IMPLANT
KIT TURNOVER KIT A (KITS) ×2 IMPLANT
NEEDLE INSUFFLATION 14GA 120MM (NEEDLE) ×2 IMPLANT
PENCIL SMOKE EVACUATOR (MISCELLANEOUS) IMPLANT
POUCH SPECIMEN RETRIEVAL 10MM (ENDOMECHANICALS) ×2 IMPLANT
SCISSORS LAP 5X35 DISP (ENDOMECHANICALS) ×2 IMPLANT
SET CHOLANGIOGRAPH MIX (MISCELLANEOUS) ×2 IMPLANT
SET IRRIG TUBING LAPAROSCOPIC (IRRIGATION / IRRIGATOR) ×2 IMPLANT
SET TUBE SMOKE EVAC HIGH FLOW (TUBING) ×2 IMPLANT
SLEEVE XCEL OPT CAN 5 100 (ENDOMECHANICALS) ×4 IMPLANT
SUT MNCRL AB 4-0 PS2 18 (SUTURE) ×2 IMPLANT
TOWEL OR 17X26 10 PK STRL BLUE (TOWEL DISPOSABLE) ×2 IMPLANT
TOWEL OR NON WOVEN STRL DISP B (DISPOSABLE) ×2 IMPLANT
TRAY LAPAROSCOPIC (CUSTOM PROCEDURE TRAY) ×2 IMPLANT
TROCAR BLADELESS OPT 5 100 (ENDOMECHANICALS) ×2 IMPLANT
TROCAR XCEL 12X100 BLDLESS (ENDOMECHANICALS) ×2 IMPLANT

## 2020-08-08 NOTE — Discharge Instructions (Signed)
LAPAROSCOPIC SURGERY: POST OP INSTRUCTIONS   EAT Gradually transition to a high fiber diet with a fiber supplement over the next few weeks after discharge.  Start with a pureed / full liquid diet (see below)  WALK Walk an hour a day.  Control your pain to do that.    CONTROL PAIN Control pain so that you can walk, sleep, tolerate sneezing/coughing, go up/down stairs.  HAVE A BOWEL MOVEMENT DAILY Keep your bowels regular to avoid problems.  OK to try a laxative to override constipation.  OK to use an antidairrheal to slow down diarrhea.  Call if not better after 2 tries  CALL IF YOU HAVE PROBLEMS/CONCERNS Call if you are still struggling despite following these instructions. Call if you have concerns not answered by these instructions    DIET: Follow a light bland diet & liquids the first 24 hours after arrival home, such as soup, liquids, starches, etc.  Be sure to drink plenty of fluids.  Quickly advance to a usual solid diet within a few days.  Avoid fast food or heavy meals as your are more likely to get nauseated or have irregular bowels.  A low-sugar, high-fiber diet for the rest of your life is ideal.  Take your usually prescribed home medications unless otherwise directed.  PAIN CONTROL: Pain is best controlled by a usual combination of three different methods TOGETHER: Ice/Heat Over the counter pain medication Prescription pain medication Most patients will experience some swelling and bruising around the incisions.  Ice packs or heating pads (30-60 minutes up to 6 times a day) will help. Use ice for the first few days to help decrease swelling and bruising, then switch to heat to help relax tight/sore spots and speed recovery.  Some people prefer to use ice alone, heat alone, alternating between ice & heat.  Experiment to what works for you.  Swelling and bruising can take several weeks to resolve.   It is helpful to take an over-the-counter pain medication regularly for the  first few days: Naproxen (Aleve, etc)  Two 220mg tabs twice a day OR Ibuprofen (Advil, etc) Three 200mg tabs four times a day (every meal & bedtime) AND Acetaminophen (Tylenol, etc) 500-650mg four times a day (every meal & bedtime) A  prescription for pain medication (such as oxycodone, hydrocodone, tramadol, gabapentin, methocarbamol, etc) should be given to you upon discharge.  Take your pain medication as prescribed, IF NEEDED.  If you are having problems/concerns with the prescription medicine (does not control pain, nausea, vomiting, rash, itching, etc), please call us [phone] to see if we need to switch you to a different pain medicine that will work better for you and/or control your side effect better. If you need a refill on your pain medication, please give us 48 hour notice.  contact your pharmacy.  They will contact our office to request authorization. Prescriptions will not be filled after 5 pm or on week-ends  Avoid getting constipated.   Between the surgery and the pain medications, it is common to experience some constipation.   Increasing fluid intake and taking a fiber supplement (such as Metamucil, Citrucel, FiberCon, MiraLax, etc) 1-2 times a day regularly will usually help prevent this problem from occurring.   A mild laxative (prune juice, Milk of Magnesia, MiraLax, etc) should be taken according to package directions if there are no bowel movements after 48 hours.   Watch out for diarrhea.   If you have many loose bowel movements, simplify your diet   to bland foods & liquids for a few days.   Stop any stool softeners and decrease your fiber supplement.   Switching to mild anti-diarrheal medications (Kayopectate, Pepto Bismol) can help.   If this worsens or does not improve, please call us.  Wash / shower every day.  You may shower over the skin glue which is waterproof  Glue will flake off after about 2 weeks.  You may leave the incision open to air.  You may replace a  dressing/Band-Aid to cover the incision for comfort if you wish.   ACTIVITIES as tolerated:   You may resume regular (light) daily activities beginning the next day--such as daily self-care, walking, climbing stairs--gradually increasing activities as tolerated.  If you can walk 30 minutes without difficulty, it is safe to try more intense activity such as jogging, treadmill, bicycling, low-impact aerobics, swimming, etc. Save the most intensive and strenuous activity for last such as sit-ups, heavy lifting, contact sports, etc  Refrain from any heavy lifting or straining until you are off narcotics for pain control.   DO NOT PUSH THROUGH PAIN.  Let pain be your guide: If it hurts to do something, don't do it.  Pain is your body warning you to avoid that activity for another week until the pain goes down. You may drive when you are no longer taking prescription pain medication, you can comfortably wear a seatbelt, and you can safely maneuver your car and apply brakes. You may have sexual intercourse when it is comfortable.  FOLLOW UP in our office Please call [facility] at [phone] to set up an appointment to see your surgeon in the office for a follow-up appointment approximately 2-3 weeks after your surgery. Make sure that you call for this appointment the day you arrive home to insure a convenient appointment time.  10. IF YOU HAVE DISABILITY OR FAMILY LEAVE FORMS, BRING THEM TO THE OFFICE FOR PROCESSING.  DO NOT GIVE THEM TO YOUR DOCTOR.   WHEN TO CALL US [phone]: Poor pain control Reactions / problems with new medications (rash/itching, nausea, etc)  Fever over 101.5 F (38.5 C) Inability to urinate Nausea and/or vomiting Worsening swelling or bruising Continued bleeding from incision. Increased pain, redness, or drainage from the incision   The clinic staff is available to answer your questions during regular business hours (8:30am-5pm).  Please don't hesitate to call and ask to  speak to one of our nurses for clinical concerns.   If you have a medical emergency, go to the nearest emergency room or call 911.  A surgeon from [facility] is always on call at the hospitals   [facility] [address], [address], [place],   [address] ? MAIN: [phone] ? TOLL FREE: [phone] ?  FAX [phone] [email or web address]     

## 2020-08-08 NOTE — Anesthesia Procedure Notes (Signed)
Procedure Name: Intubation Date/Time: 08/08/2020 7:49 AM Performed by: Minerva Ends, CRNA Pre-anesthesia Checklist: Patient identified, Emergency Drugs available, Suction available and Patient being monitored Patient Re-evaluated:Patient Re-evaluated prior to induction Oxygen Delivery Method: Circle System Utilized Preoxygenation: Pre-oxygenation with 100% oxygen Induction Type: IV induction, Rapid sequence and Cricoid Pressure applied Ventilation: Mask ventilation without difficulty Grade View: Grade I Tube type: Oral Number of attempts: 1 Airway Equipment and Method: Stylet Placement Confirmation: ETT inserted through vocal cords under direct vision, positive ETCO2 and breath sounds checked- equal and bilateral Secured at: 21 cm Tube secured with: Tape Dental Injury: Teeth and Oropharynx as per pre-operative assessment  Comments: Smooth IV induction Witman - intubation AM CRNA atraumatic- teeth and mouth as preop - many missing and chipped teeth- unchanged- bilat BS

## 2020-08-08 NOTE — Progress Notes (Signed)
Case discussed with surgical team LFTs and white count still normal .IOC reviewed agree looks okay please let me know if I can be of any further assistance with this hospital stay

## 2020-08-08 NOTE — Op Note (Signed)
Operative Note  Katelyn Preston 55 y.o. female [id number]  08/08/2020  Surgeon: Berna Bue MD FACS  Procedure performed: Laparoscopic Cholecystectomy with intraoperative cholangiogram  Procedure classification: Urgent/emergent   Preop diagnosis: cholecystitis, possible choledocholithiasis  Post-op diagnosis/intraop findings: Cholecystitis, enlarged liver/hepatosteatosis no obvious choledocholithiasis on cholangiogram, right hepatic artery coursing just below the neck and infundibulum of the gallbladder  Specimens: gallbladder  Retained items: none  EBL: 30cc  Complications: none  Description of procedure: After obtaining informed consent the patient was brought to the operating room. Antibiotics were administered. SCD's were applied. General endotracheal anesthesia was initiated and a formal time-out was performed. The abdomen was prepped and draped in the usual sterile fashion and the abdomen was entered using a left subcostal Veress needle. Insufflation to was obtained, supraumbilical 61mm trocar and camera inserted, and gross inspection revealed no evidence of injury from our entry or other intraabdominal abnormalities. Two 18mm trocars were introduced in the right midclavicular and right anterior axillary lines under direct visualization and following infiltration with local. An 27mm trocar was placed in the epigastrium. The gallbladder was retracted cephalad and the infundibulum was retracted laterally.  The patient had an enlarged liver and a very intrahepatic gallbladder which made retraction and dissection difficult.  There was chronic inflammation along the cystic triangle, with the cystic duct and infundibulum tethered very close to the easily visible common bile duct medially. A combination of hook electrocautery and blunt dissection was utilized to clear the peritoneum from the neck and cystic duct, circumferentially isolating the cystic artery and cystic duct and  lifting the gallbladder from the cystic plate.  This was done with meticulous strain by strain division of transparent fibers and ensure that the very nearby common bile duct was protected.  The right hepatic artery was visible running just below the infundibulum and cystic duct and was carefully teased away from the gallbladder structures again using meticulous division of small strands and avoiding any injury to the vessel.  The cystic artery branched anteriorly and posteriorly and there was a small amount of bleeding from these vessels during the dissection which was addressed with clips.  Ultimately, the critical view of safety was achieved with the cystic artery, cystic duct, and liver bed visualized between them with no other structures.  A clip was placed on the distal cystic duct and a small ductotomy made.  The cholangiogram catheter was inserted and secured with a clip.  Cholangiogram was then performed, on preliminary read this appears to demonstrate normal anatomy with no obvious filling defects in the common bile duct, however final radiology read is pending.  The cholangiogram catheter was removed and the cystic duct ligated with 3 clips proximally and then divided.  The cystic artery/branches had been clipped and divided during dissection.  I reinspected the common bile duct and nearby right hepatic artery and these appear to be free of injury and hemostatic.  The gallbladder was dissected from the liver plate using electrocautery and blunt dissection.  There was very minimal plane between the gallbladder and the liver parenchyma and again the gallbladder was quite intrahepatic. Once freed the gallbladder was placed in an endocatch bag and removed intact through the epigastric trocar site, which did have to be extended slightly. A small amount of bleeding on the liver bed was controlled with cautery and Surgicel snow.  Minimal bile had been spilled from the gallbladder (the clipped end) during its  dissection from the liver bed. This was aspirated and the right  upper quadrant was irrigated copiously until the effluent was clear. Hemostasis was once again confirmed, and reinspection of the abdomen revealed no injuries. The clips were well opposed without any bile leak from the duct or the liver bed. The 66mm trocar site in the epigastrium was closed with two simple interrupted 0 vicryls in the fascia under direct visualization using a PMI device. The abdomen was desufflated and all trocars removed. The skin incisions were closed with subcuticular 4-0 monocryl and Dermabond. The patient was awakened, extubated and transported to the recovery room in stable condition.    All counts were correct at the completion of the case.

## 2020-08-08 NOTE — Anesthesia Postprocedure Evaluation (Signed)
Anesthesia Post Note  Patient: Careers adviser  Procedure(s) Performed: LAPAROSCOPIC CHOLECYSTECTOMY WITH INTRAOPERATIVE CHOLANGIOGRAM (Abdomen)     Patient location during evaluation: PACU Anesthesia Type: General Level of consciousness: awake and alert Pain management: pain level controlled Vital Signs Assessment: post-procedure vital signs reviewed and stable Respiratory status: spontaneous breathing, nonlabored ventilation and respiratory function stable Cardiovascular status: blood pressure returned to baseline and stable Postop Assessment: no apparent nausea or vomiting Anesthetic complications: no   No notable events documented.  Last Vitals:  Vitals:   08/08/20 1053 08/08/20 1204  BP: (!) 159/96 (!) 163/91  Pulse: 69 69  Resp: 18 16  Temp: 36.6 C 36.5 C  SpO2: 100% 100%    Last Pain:  Vitals:   08/08/20 1204  TempSrc: Oral  PainSc:                  Lucretia Kern

## 2020-08-08 NOTE — Transfer of Care (Signed)
Immediate Anesthesia Transfer of Care Note  Patient: Katelyn Preston  Procedure(s) Performed: LAPAROSCOPIC CHOLECYSTECTOMY WITH INTRAOPERATIVE CHOLANGIOGRAM (Abdomen)  Patient Location: PACU  Anesthesia Type:General  Level of Consciousness: awake and alert   Airway & Oxygen Therapy: Patient Spontanous Breathing and Patient connected to face mask oxygen  Post-op Assessment: Report given to RN and Post -op Vital signs reviewed and stable  Post vital signs: Reviewed and stable  Last Vitals:  Vitals Value Taken Time  BP 156/93 08/08/20 0930  Temp 36.4 C 08/08/20 0930  Pulse 79 08/08/20 0932  Resp 14 08/08/20 0932  SpO2 100 % 08/08/20 0932  Vitals shown include unvalidated device data.  Last Pain:  Vitals:   08/08/20 0930  TempSrc:   PainSc: 0-No pain         Complications: No notable events documented.

## 2020-08-08 NOTE — Anesthesia Preprocedure Evaluation (Addendum)
Anesthesia Evaluation  Patient identified by MRN, date of birth, ID band Patient awake    Reviewed: Allergy & Precautions, NPO status , Patient's Chart, lab work & pertinent test results  History of Anesthesia Complications Negative for: history of anesthetic complications  Airway Mallampati: II  TM Distance: >3 FB Neck ROM: Full    Dental  (+) Teeth Intact, Dental Advisory Given   Pulmonary sleep apnea ,    Pulmonary exam normal        Cardiovascular hypertension, +CHF  Normal cardiovascular exam+ dysrhythmias Atrial Fibrillation      Neuro/Psych negative neurological ROS     GI/Hepatic negative GI ROS, Neg liver ROS,   Endo/Other  Morbid obesity  Renal/GU negative Renal ROS  negative genitourinary   Musculoskeletal negative musculoskeletal ROS (+)   Abdominal   Peds  Hematology  (+) anemia ,   Anesthesia Other Findings  Echo 2019:   NORMAL LEFT VENTRICULAR SYSTOLIC FUNCTION WITH MODERATE LVH  NORMAL RIGHT VENTRICULAR SYSTOLIC FUNCTION  VALVULAR REGURGITATION: TRIVIAL AR, MILD MR, TRIVIAL PR, TRIVIAL TR  NO VALVULAR STENOSIS  DIFFICULT TO ASSESS LV FUNCTION IN SETTING OF A-FLUTTER AND TACHYCARDIA  GROSSLY NORMAL LV/RV FUNCTION  NO PRIOR FOR COMPARISON    Reproductive/Obstetrics negative OB ROS                            Anesthesia Physical Anesthesia Plan  ASA: 3  Anesthesia Plan: General   Post-op Pain Management:    Induction: Intravenous and Rapid sequence  PONV Risk Score and Plan: 4 or greater and Ondansetron, Dexamethasone, Treatment may vary due to age or medical condition and Midazolam  Airway Management Planned: Oral ETT  Additional Equipment: None  Intra-op Plan:   Post-operative Plan: Extubation in OR  Informed Consent: I have reviewed the patients History and Physical, chart, labs and discussed the procedure including the risks, benefits and  alternatives for the proposed anesthesia with the patient or authorized representative who has indicated his/her understanding and acceptance.     Dental advisory given  Plan Discussed with:   Anesthesia Plan Comments:         Anesthesia Quick Evaluation

## 2020-08-08 NOTE — Progress Notes (Signed)
PROGRESS NOTE    Katelyn Preston  ZOX:[id number] DOB: 1965/07/10 DOA: 08/06/2020 PCP: Patient, No Pcp Per (Inactive)    Brief Narrative:  This 55 years old female with PMH significant for hypertension, chronic diastolic heart failure, paroxysmal A. fib on Eliquis, obstructive sleep apnea on CPAP, prediabetes and morbid obesity presents in the ED with complaint of abdominal pain and nausea.  She reports intermittent upper right quadrant pain following fast food.  She reports worsening epigastric pain following drinking a mocha yesterday associated with nausea and vomiting.  Ultrasound negative for cholecystitis but had multiple gallstones and intrahepatic ductal dilatation.  MRCP: Cholelithiasis with mild extrahepatic biliary dilatation and suspicion for a small distal CBD stone on some sequences. General surgery is consulted.  Patient underwent laparoscopic cholecystectomy with intraoperative cholangiogram, no choledocholithiasis were found.  Assessment & Plan:   Active Problems:   Dilation of common bile duct   Symptomatic cholelithiasis   Thrombocytopenia (HCC)   Anemia   AF (paroxysmal atrial fibrillation) (HCC)   Chronic diastolic CHF (congestive heart failure) (HCC)   HTN (hypertension)   OSA (obstructive sleep apnea)   Morbid obesity with BMI of 50.0-59.9, adult (HCC)   Cholelithiasis with CBD and intrahepatic duct dilatation: Patient reports intermittent RUQ pain following fast food. She has worsening of right upper quadrant pain following having Mocha recently. Her LFTs are normal. General surgery recommended MRCP prior to any intervention. MRCP showed Cholelithiasis with mild extrahepatic biliary dilatation and suspicion for a small distal CBD stone on some sequences. Patient wants to have gallbladder removed during this hospitalization. Patient underwent laparoscopic cholecystectomy with intraoperative cholangiogram on 08/08/20 , No CBD stones were found. Continue adequate  pain control.  GI was consulted for possibility of ERCP but seems like there is no need.  Thrombocytopenia: It seems like it was a lab error 58,   3 weeks ago 247 repeat CBC shows platelet count 199 > 202   Normochromic Normocytic Anemia Baseline Hb 10-12. Iron studies, B12 and folic acid normal.   Paroxysmal atrial fibrillation CHA2DS2-VASc score of 3 (gender, CHF, HTN) Hold Eliquis,  Last dose the evening of 6/30. Will resume Eliquis once cleared from general surgery.   Chronic diastolic heart failure Patient appears euvolemic,  Continue atenolol, Torsemide   Hypertension Continue amlodipine, lisinopril   OSA Continue CPAP   Morbid obesity BMI of greater than 50.  Patient currently being [facility] for potential gastric bypass   DVT prophylaxis: SCDs Code Status: Full code Family Communication: No family at bedside Disposition Plan:   Status is: Inpatient  Remains inpatient appropriate because:Inpatient level of care appropriate due to severity of illness  Dispo: The patient is from: Home              Anticipated d/c is to: Home              Patient currently is not medically stable to d/c.   Difficult to place patient No  Consultants:  General surgery  Procedures: Abdominal ultrasound and MRCP.  Antimicrobials:   Anti-infectives (From admission, onward)    Start     Dose/Rate Route Frequency Ordered Stop   08/08/20 0657  ceFAZolin (ANCEF) 2-4 GM/100ML-% IVPB       Note to Pharmacy: Armandina GemmaMirarchi, Angela   : cabinet override      08/08/20 0657 08/08/20 0820   08/08/20 0600  [MAR Hold]  ceFAZolin (ANCEF) IVPB 2g/100 mL premix        (MAR Hold  since Sun 08/08/2020 at 0746.Hold Reason: Transfer to a Procedural area)   2 g 200 mL/hr over 30 Minutes Intravenous On call to O.R. 08/07/20 1416 08/08/20 2706        Subjective: Patient was seen and examined at bedside. She reports feeling better, She is s/p laparoscopic cholecystectomy with  intraoperative cholangiogram. She tolerated procedure well,  she reports having pain but is controlled. Objective: Vitals:   08/08/20 0953 08/08/20 1000 08/08/20 1010 08/08/20 1016  BP:  137/81  (!) 143/80  Pulse: 63 73 66 76  Resp: 16 16 17 15   Temp:    97.6 F (36.4 C)  TempSrc:      SpO2: 98% 99% 99% 99%  Weight:      Height:        Intake/Output Summary (Last 24 hours) at 08/08/2020 1040 Last data filed at 08/08/2020 1008 Gross per 24 hour  Intake 1000 ml  Output 2630 ml  Net -1630 ml   Filed Weights   08/06/20 1532 08/06/20 1957  Weight: (!) 161 kg (!) 161.7 kg    Examination:  General exam: Appears calm and comfortable, not in any acute distress. Respiratory system: Clear to auscultation. Respiratory effort normal. Cardiovascular system: S1 & S2 heard, RRR. No JVD, murmurs, rubs, gallops or clicks. No pedal edema. Gastrointestinal system: Abdomen is nondistended, soft and sore,  No guarding,  No organomegaly or masses felt. Normal bowel sounds heard. Surgical scars noted. Central nervous system: Alert and oriented. No focal neurological deficits. Extremities: Symmetric 5 x 5 power.  No edema, no cyanosis, no clubbing. Skin: No rashes, lesions or ulcers Psychiatry: Judgement and insight appear normal. Mood & affect appropriate.     Data Reviewed: I have personally reviewed following labs and imaging studies  CBC: Recent Labs  Lab 08/06/20 1555 08/07/20 0438 08/08/20 0418  WBC 6.2 6.8 6.3  NEUTROABS 4.5  --   --   HGB 10.0* 11.2* 10.9*  HCT 31.4* 35.0* 34.4*  MCV 95.4 94.6 94.5  PLT 58* 199 202   Basic Metabolic Panel: Recent Labs  Lab 08/06/20 1555 08/07/20 0438 08/08/20 0418  NA 142 141 140  K 4.3 3.7 3.6  CL 108 105 104  CO2 29 28 29   GLUCOSE 117* 105* 103*  BUN 12 9 8   CREATININE 0.72 0.81 0.79  CALCIUM 9.3 9.0 9.0   GFR: Estimated Creatinine Clearance: 130.7 mL/min (by C-G formula based on SCr of 0.79 mg/dL). Liver Function Tests: Recent  Labs  Lab 08/06/20 1555 08/07/20 0438 08/08/20 0418  AST 24 16 15   ALT 15 15 14   ALKPHOS 92 87 82  BILITOT 0.9 0.8 1.0  PROT 7.8 7.1 6.8  ALBUMIN 4.2 3.7 3.6   Recent Labs  Lab 08/06/20 1555  LIPASE 35   No results for input(s): AMMONIA in the last 168 hours. Coagulation Profile: No results for input(s): INR, PROTIME in the last 168 hours. Cardiac Enzymes: No results for input(s): CKTOTAL, CKMB, CKMBINDEX, TROPONINI in the last 168 hours. BNP (last 3 results) No results for input(s): PROBNP in the last 8760 hours. HbA1C: No results for input(s): HGBA1C in the last 72 hours. CBG: No results for input(s): GLUCAP in the last 168 hours. Lipid Profile: No results for input(s): CHOL, HDL, LDLCALC, TRIG, CHOLHDL, LDLDIRECT in the last 72 hours. Thyroid Function Tests: No results for input(s): TSH, T4TOTAL, FREET4, T3FREE, THYROIDAB in the last 72 hours. Anemia Panel: Recent Labs    08/06/20 1813  VITAMINB12 324  FOLATE  9.2  TIBC 330  IRON 31   Sepsis Labs: No results for input(s): PROCALCITON, LATICACIDVEN in the last 168 hours.  Recent Results (from the past 240 hour(s))  Resp Panel by RT-PCR (Flu A&B, Covid) Nasopharyngeal Swab     Status: None   Collection Time: 08/06/20  6:13 PM   Specimen: Nasopharyngeal Swab; Nasopharyngeal(NP) swabs in vial transport medium  Result Value Ref Range Status   SARS Coronavirus 2 by RT PCR NEGATIVE NEGATIVE Final    Comment: (NOTE) SARS-CoV-2 target nucleic acids are NOT DETECTED.  The SARS-CoV-2 RNA is generally detectable in upper respiratory specimens during the acute phase of infection. The lowest concentration of SARS-CoV-2 viral copies this assay can detect is 138 copies/mL. A negative result does not preclude SARS-Cov-2 infection and should not be used as the sole basis for treatment or other patient management decisions. A negative result may occur with  improper specimen collection/handling, submission of specimen  other than nasopharyngeal swab, presence of viral mutation(s) within the areas targeted by this assay, and inadequate number of viral copies(<138 copies/mL). A negative result must be combined with clinical observations, patient history, and epidemiological information. The expected result is Negative.  Fact Sheet for Patients:  BloggerCourse.com  Fact Sheet for Healthcare Providers:  SeriousBroker.it  This test is no t yet approved or cleared by the [place] FDA and  has been authorized for detection and/or diagnosis of SARS-CoV-2 by FDA under an Emergency Use Authorization (EUA). This EUA will remain  in effect (meaning this test can be used) for the duration of the COVID-19 declaration under Section 564(b)(1) of the Act, 21 U.S.C.section 360bbb-3(b)(1), unless the authorization is terminated  or revoked sooner.       Influenza A by PCR NEGATIVE NEGATIVE Final   Influenza B by PCR NEGATIVE NEGATIVE Final    Comment: (NOTE) The Xpert Xpress SARS-CoV-2/FLU/RSV plus assay is intended as an aid in the diagnosis of influenza from Nasopharyngeal swab specimens and should not be used as a sole basis for treatment. Nasal washings and aspirates are unacceptable for Xpert Xpress SARS-CoV-2/FLU/RSV testing.  Fact Sheet for Patients: BloggerCourse.com  Fact Sheet for Healthcare Providers: SeriousBroker.it  This test is not yet approved or cleared by the [place] FDA and has been authorized for detection and/or diagnosis of SARS-CoV-2 by FDA under an Emergency Use Authorization (EUA). This EUA will remain in effect (meaning this test can be used) for the duration of the COVID-19 declaration under Section 564(b)(1) of the Act, 21 U.S.C. section 360bbb-3(b)(1), unless the authorization is terminated or revoked.  Performed at [facility], [address] [place], [place] [address]     Radiology Studies: DG Chest 2 View  Result Date: 08/06/2020 CLINICAL DATA:  Chest pain EXAM: CHEST - 2 VIEW COMPARISON:  11/13/2017 FINDINGS: Mild cardiomegaly. No focal opacity or pleural effusion. No pneumothorax. IMPRESSION: No active cardiopulmonary disease.  Cardiomegaly Electronically Signed   By: Jasmine Pang M.D.   On: 08/06/2020 17:49   DG Cholangiogram Operative  Result Date: 08/08/2020 CLINICAL DATA:  Cholelithiasis. EXAM: INTRAOPERATIVE CHOLANGIOGRAM TECHNIQUE: Cholangiographic images from the C-arm fluoroscopic device were submitted for interpretation post-operatively. Please see the procedural report for the amount of contrast and the fluoroscopy time utilized. COMPARISON:  Ultrasound 08/06/2020 FINDINGS: Contrast opacification of the intrahepatic and extrahepatic biliary system. Mild fullness of the biliary system with drainage into the duodenum. No large filling defects or stones. IMPRESSION: Patent biliary system. Electronically Signed   By: Madelaine Bhat  Lowella Dandy M.D.   On: 08/08/2020 09:16   MR ABDOMEN MRCP W WO CONTAST  Result Date: 08/06/2020 CLINICAL DATA:  Right upper quadrant abdominal pain. Mild biliary dilatation. Gallstones. EXAM: MRI ABDOMEN WITHOUT AND WITH CONTRAST (INCLUDING MRCP) TECHNIQUE: Multiplanar multisequence MR imaging of the abdomen was performed both before and after the administration of intravenous contrast. Heavily T2-weighted images of the biliary and pancreatic ducts were obtained, and three-dimensional MRCP images were rendered by post processing. CONTRAST:  13mL GADAVIST GADOBUTROL 1 MMOL/ML IV SOLN COMPARISON:  08/06/2020 FINDINGS: Despite efforts by the technologist and patient, motion artifact is present on today's exam and could not be eliminated. This reduces exam sensitivity and specificity. Body habitus reduces diagnostic sensitivity and specificity. Lower chest: Cardiomegaly. Hepatobiliary: 2.8 cm hemangioma in the left hepatic  lobe as shown on image 22 series 34. Several large gallstones are present in the gallbladder measuring up to 2.7 cm in diameter. The common hepatic duct measures 0.7 cm in diameter. The common bile duct measures 0.6 cm in diameter, mildly dilated. On image 7 of series 8, I suspect a small distal CBD stone, also suggested on 24-25 of series 3. Admittedly this is less apparent on other sequences. No abnormal enhancement along the biliary tree. No significant intrahepatic biliary dilatation. Pancreas:  Pancreas divisum. Spleen:  Unremarkable Adrenals/Urinary Tract:  Unremarkable Stomach/Bowel: Unremarkable Vascular/Lymphatic:  Unremarkable Other:  No supplemental non-categorized findings. Musculoskeletal: Lower lumbar degenerative disc disease and degenerative endplate findings. IMPRESSION: 1. Cholelithiasis with mild extrahepatic biliary dilatation and suspicion for a small distal CBD stone on some sequences. Diagnostic accuracy is adversely affected by motion artifact and patient body habitus. 2. 2.8 cm hemangioma in the left hepatic lobe. 3. Cardiomegaly. 4. Pancreas divisum. 5. Lower lumbar degenerative disc disease. Electronically Signed   By: Gaylyn Rong M.D.   On: 08/06/2020 22:01   US Abdomen Limited RUQ (LIVER/GB)  Result Date: 08/06/2020 CLINICAL DATA:  Right upper quadrant pain EXAM: ULTRASOUND ABDOMEN LIMITED RIGHT UPPER QUADRANT COMPARISON:  None. FINDINGS: Gallbladder: Partially distended with multiple gallstones within. No pericholecystic fluid is noted. Negative sonographic Murphy's sign is elicited. Common bile duct: Diameter: 9.4 mm. Liver: Increased in echogenicity consistent with fatty infiltration. Intrahepatic biliary ductal dilatation is noted as well. Portal vein is patent on color Doppler imaging with normal direction of blood flow towards the liver. Other: None. IMPRESSION: Multiple gallstones within a partially distended gallbladder. Dilated common bile duct 9.4 mm with intrahepatic  ductal dilatation. This raises suspicion for distal common bile duct stone. Electronically Signed   By: Alcide Clever M.D.   On: 08/06/2020 16:24    Scheduled Meds:  amisulpride       [MAR Hold] amLODipine  10 mg Oral Daily   [MAR Hold] atenolol  50 mg Oral Daily   fentaNYL       fentaNYL       [MAR Hold] lisinopril  40 mg Oral Daily   [MAR Hold] magnesium oxide  400 mg Oral Daily   oxyCODONE       [MAR Hold] potassium chloride  20 mEq Oral Daily   [MAR Hold] torsemide  20 mg Oral QODAY   Continuous Infusions:  lactated ringers       LOS: 2 days    Time spent: 25 mins    Angelus Hoopes, MD [facility]   If 7PM-7AM, please contact night-coverage

## 2020-08-08 NOTE — Progress Notes (Signed)
Day of Surgery   Subjective/Chief Complaint: Feeling well this morning, no pain  Objective: Vital signs in last 24 hours: Temp:  [97.8 F (36.6 C)-98.3 F (36.8 C)] 97.8 F (36.6 C) (07/03 0619) Pulse Rate:  [50-61] 61 (07/03 0619) Resp:  [16-18] 18 (07/03 0619) BP: (92-117)/(52-71) 92/52 (07/03 0619) SpO2:  [96 %-100 %] 96 % (07/03 0619) Last BM Date: 08/05/20  Intake/Output from previous day: 07/02 0701 - 07/03 0700 In: 0  Out: 2600 [Urine:2600] Intake/Output this shift: No intake/output data recorded.  General appearance: Alert, well-appearing  Lab Results:  Recent Labs    08/07/20 0438 08/08/20 0418  WBC 6.8 6.3  HGB 11.2* 10.9*  HCT 35.0* 34.4*  PLT 199 202   BMET Recent Labs    08/07/20 0438 08/08/20 0418  NA 141 140  K 3.7 3.6  CL 105 104  CO2 28 29  GLUCOSE 105* 103*  BUN 9 8  CREATININE 0.81 0.79  CALCIUM 9.0 9.0   PT/INR No results for input(s): LABPROT, INR in the last 72 hours. ABG No results for input(s): PHART, HCO3 in the last 72 hours.  Invalid input(s): PCO2, PO2  Studies/Results: DG Chest 2 View  Result Date: 08/06/2020 CLINICAL DATA:  Chest pain EXAM: CHEST - 2 VIEW COMPARISON:  11/13/2017 FINDINGS: Mild cardiomegaly. No focal opacity or pleural effusion. No pneumothorax. IMPRESSION: No active cardiopulmonary disease.  Cardiomegaly Electronically Signed   By: Jasmine Pang M.D.   On: 08/06/2020 17:49   MR ABDOMEN MRCP W WO CONTAST  Result Date: 08/06/2020 CLINICAL DATA:  Right upper quadrant abdominal pain. Mild biliary dilatation. Gallstones. EXAM: MRI ABDOMEN WITHOUT AND WITH CONTRAST (INCLUDING MRCP) TECHNIQUE: Multiplanar multisequence MR imaging of the abdomen was performed both before and after the administration of intravenous contrast. Heavily T2-weighted images of the biliary and pancreatic ducts were obtained, and three-dimensional MRCP images were rendered by post processing. CONTRAST:  37mL GADAVIST GADOBUTROL 1 MMOL/ML IV  SOLN COMPARISON:  08/06/2020 FINDINGS: Despite efforts by the technologist and patient, motion artifact is present on today's exam and could not be eliminated. This reduces exam sensitivity and specificity. Body habitus reduces diagnostic sensitivity and specificity. Lower chest: Cardiomegaly. Hepatobiliary: 2.8 cm hemangioma in the left hepatic lobe as shown on image 22 series 34. Several large gallstones are present in the gallbladder measuring up to 2.7 cm in diameter. The common hepatic duct measures 0.7 cm in diameter. The common bile duct measures 0.6 cm in diameter, mildly dilated. On image 7 of series 8, I suspect a small distal CBD stone, also suggested on 24-25 of series 3. Admittedly this is less apparent on other sequences. No abnormal enhancement along the biliary tree. No significant intrahepatic biliary dilatation. Pancreas:  Pancreas divisum. Spleen:  Unremarkable Adrenals/Urinary Tract:  Unremarkable Stomach/Bowel: Unremarkable Vascular/Lymphatic:  Unremarkable Other:  No supplemental non-categorized findings. Musculoskeletal: Lower lumbar degenerative disc disease and degenerative endplate findings. IMPRESSION: 1. Cholelithiasis with mild extrahepatic biliary dilatation and suspicion for a small distal CBD stone on some sequences. Diagnostic accuracy is adversely affected by motion artifact and patient body habitus. 2. 2.8 cm hemangioma in the left hepatic lobe. 3. Cardiomegaly. 4. Pancreas divisum. 5. Lower lumbar degenerative disc disease. Electronically Signed   By: Gaylyn Rong M.D.   On: 08/06/2020 22:01   US Abdomen Limited RUQ (LIVER/GB)  Result Date: 08/06/2020 CLINICAL DATA:  Right upper quadrant pain EXAM: ULTRASOUND ABDOMEN LIMITED RIGHT UPPER QUADRANT COMPARISON:  None. FINDINGS: Gallbladder: Partially distended with multiple gallstones within. No  pericholecystic fluid is noted. Negative sonographic Murphy's sign is elicited. Common bile duct: Diameter: 9.4 mm. Liver: Increased  in echogenicity consistent with fatty infiltration. Intrahepatic biliary ductal dilatation is noted as well. Portal vein is patent on color Doppler imaging with normal direction of blood flow towards the liver. Other: None. IMPRESSION: Multiple gallstones within a partially distended gallbladder. Dilated common bile duct 9.4 mm with intrahepatic ductal dilatation. This raises suspicion for distal common bile duct stone. Electronically Signed   By: Alcide Clever M.D.   On: 08/06/2020 16:24    Anti-infectives: Anti-infectives (From admission, onward)    Start     Dose/Rate Route Frequency Ordered Stop   08/08/20 0657  ceFAZolin (ANCEF) 2-4 GM/100ML-% IVPB       Note to Pharmacy: Armandina Gemma   : cabinet override      08/08/20 0657 08/08/20 1914   08/08/20 0600  ceFAZolin (ANCEF) IVPB 2g/100 mL premix        2 g 200 mL/hr over 30 Minutes Intravenous On call to O.R. 08/07/20 1416 08/09/20 0559       Assessment/Plan: Possible choledocholithiasis.  Case reviewed with Dr. Ewing Schlein. I recommend proceeding with laparoscopic cholecystectomy with cholangiogram. Discussed relevant anatomy, technical aspects and risks of surgery including bleeding, pain, scarring, intraabdominal injury specifically to the common bile duct and sequelae, bile leak, conversion to open surgery, blood clot, pneumonia, heart attack, stroke, failure to resolve symptoms, etc. Questions welcomed and answered.   LOS: 2 days    Berna Bue 08/08/2020

## 2020-08-09 LAB — COMPREHENSIVE METABOLIC PANEL
ALT: 39 U/L (ref 0–44)
AST: 52 U/L — ABNORMAL HIGH (ref 15–41)
Albumin: 3.8 g/dL (ref 3.5–5.0)
Alkaline Phosphatase: 86 U/L (ref 38–126)
Anion gap: 8 (ref 5–15)
BUN: 8 mg/dL (ref 6–20)
CO2: 28 mmol/L (ref 22–32)
Calcium: 9.2 mg/dL (ref 8.9–10.3)
Chloride: 104 mmol/L (ref 98–111)
Creatinine, Ser: 0.77 mg/dL (ref 0.44–1.00)
GFR, Estimated: >60 mL/min (ref >60)
Glucose, Bld: 129 mg/dL — ABNORMAL HIGH (ref 70–99)
Potassium: 4 mmol/L (ref 3.5–5.1)
Sodium: 140 mmol/L (ref 135–145)
Total Bilirubin: 0.9 mg/dL (ref 0.3–1.2)
Total Protein: 7.3 g/dL (ref 6.5–8.1)

## 2020-08-09 LAB — MAGNESIUM: Magnesium: 1.9 mg/dL (ref 1.7–2.4)

## 2020-08-09 LAB — CBC
HCT: 36.4 % (ref 36.0–46.0)
Hemoglobin: 11.9 g/dL — ABNORMAL LOW (ref 12.0–15.0)
MCH: 30.5 pg (ref 26.0–34.0)
MCHC: 32.7 g/dL (ref 30.0–36.0)
MCV: 93.3 fL (ref 80.0–100.0)
Platelets: 215 10*3/uL (ref 150–400)
RBC: 3.9 MIL/uL (ref 3.87–5.11)
RDW: 13.9 % (ref 11.5–15.5)
WBC: 12.3 10*3/uL — ABNORMAL HIGH (ref 4.0–10.5)
nRBC: 0 % (ref 0.0–0.2)

## 2020-08-09 LAB — PHOSPHORUS: Phosphorus: 3.5 mg/dL (ref 2.5–4.6)

## 2020-08-09 MED ORDER — ACETAMINOPHEN 325 MG PO TABS
650.0000 mg | ORAL_TABLET | Freq: Four times a day (QID) | ORAL | Status: DC
  Administered 2020-08-09 (×2): 650 mg via ORAL
  Filled 2020-08-09 (×2): qty 2

## 2020-08-09 NOTE — Discharge Summary (Addendum)
Physician Discharge Summary  [facility] BZJ:[id number] DOB: 01-18-1966 DOA: 08/06/2020  PCP: Patient, No Pcp Per (Inactive)  Admit date: 08/06/2020  Discharge date: 08/09/2020  Admitted From: Home.  Disposition:  HOME.  Recommendations for Outpatient Follow-up:  Follow up with PCP in 1-2 weeks. Please obtain BMP/CBC in one week. Patient underwent Laparoscopic cholecystectomy, tolerated well Advised to follow up General surgery as scheduled.  Home Health: None Equipment/Devices: None  Discharge Condition: Good CODE STATUS: Full code Diet recommendation: Heart Healthy   Brief Summary/ Hospital Course: This 55 years old female with PMH significant for hypertension, chronic diastolic heart failure, paroxysmal A. fib on Eliquis, obstructive sleep apnea on CPAP, prediabetes and morbid obesity presented in the ED with complaint of abdominal pain and nausea. She reports having intermittent right upper quadrant pain following fast food.  She reports worsening epigastric pain following drinking a mocha yesterday associated with nausea and vomiting.  Ultrasound negative for cholecystitis but had multiple gallstones and intrahepatic ductal dilatation.  MRCP: Cholelithiasis with mild extrahepatic biliary dilatation and suspicion for a small distal CBD stone on some sequences.  Patient was admitted for symptomatic cholelithiasis, General surgery was consulted.  Patient underwent laparoscopic cholecystectomy with intraoperative cholangiogram on 08/08/20, no choledocholithiasis were found. GI was consulted to see if ERCP is required.  After intraoperative cholangiogram was complete, GI signed off recommended there is no need for ERCP.  Patient was started on clear liquid diet,  tolerated well then advanced to full liquids to soft diet.  She was able to pass flatus and had a bowel movement. Patient is cleared from general surgery.  Patient is being discharged home,  Patient will follow up with general  surgery in 1 week.  She was managed for below problems.  Discharge Diagnoses:  Active Problems:   Dilation of common bile duct   Symptomatic cholelithiasis   Thrombocytopenia (HCC)   Anemia   AF (paroxysmal atrial fibrillation) (HCC)   Chronic diastolic CHF (congestive heart failure) (HCC)   HTN (hypertension)   OSA (obstructive sleep apnea)   Morbid obesity with BMI of 50.0-59.9, adult (HCC)  Cholelithiasis with CBD and intrahepatic duct dilatation: Patient reports having intermittent RUQ pain following fast food. She has worsening of right upper quadrant pain following having Mocha recently. Her LFTs are normal. General surgery recommended MRCP prior to any intervention. MRCP showed Cholelithiasis with mild extrahepatic biliary dilatation and suspicion for a small distal CBD stone on some sequences. Patient wants to have gallbladder removed during this hospitalization. Patient underwent laparoscopic cholecystectomy with intraoperative cholangiogram on 08/08/20 , No CBD stones were found. Continue adequate pain control.  GI was consulted for possibility of ERCP but seems like there is no need.   Thrombocytopenia: It seems like it was a lab error 58,   3 weeks ago 247 repeat CBC shows platelet count 199 > 202    Normochromic Normocytic Anemia Baseline Hb 10-12. Iron studies, B12 and folic acid normal.   Paroxysmal atrial fibrillation CHA2DS2-VASc score of 3 (gender, CHF, HTN) Hold Eliquis,  Last dose the evening of 6/30. Eliquis resumed at discharge.   Chronic diastolic heart failure Patient appears euvolemic,  Continue atenolol, Torsemide   Hypertension Continue amlodipine, lisinopril   OSA Continue CPAP   Morbid obesity BMI of greater than 50.  Patient currently being evaluated by general surgery at [facility] for potential gastric bypass   Discharge Instructions  Discharge Instructions     Call MD for:  difficulty breathing, headache or visual disturbances  Complete by: As directed    Call MD for:  persistant dizziness or light-headedness   Complete by: As directed    Call MD for:  persistant nausea and vomiting   Complete by: As directed    Call MD for:  severe uncontrolled pain   Complete by: As directed    Diet - low sodium heart healthy   Complete by: As directed    Diet Carb Modified   Complete by: As directed    Discharge instructions   Complete by: As directed    Patient underwent Laparoscopic cholecystectomy, tolerated well Advised to follow up General surgery as scheduled.   Increase activity slowly   Complete by: As directed       Allergies as of 08/09/2020       Reactions   Avocado Nausea And Vomiting   Eggs Or Egg-derived Products Nausea And Vomiting        Medication List     STOP taking these medications    clindamycin 150 MG capsule Commonly known as: CLEOCIN       TAKE these medications    albuterol 108 (90 Base) MCG/ACT inhaler Commonly known as: VENTOLIN HFA Inhale 2 puffs into the lungs every 4 (four) hours as needed for wheezing or shortness of breath (cough, shortness of breath or wheezing.).   amLODipine 10 MG tablet Commonly known as: NORVASC Take 10 mg by mouth daily.   atenolol 50 MG tablet Commonly known as: TENORMIN Take 50 mg by mouth daily.   Biotin 1000 MCG tablet Take 1,000 mcg by mouth daily.   chlorhexidine 0.12 % solution Commonly known as: Peridex Use as directed 15 mLs in the mouth or throat 2 (two) times daily. What changed:  when to take this additional instructions   docusate sodium 100 MG capsule Commonly known as: Colace Take 1 capsule (100 mg total) by mouth 2 (two) times daily. Okay to decrease to once daily or stop taking if having loose bowel movements   Eliquis 5 MG Tabs tablet Generic drug: apixaban Take 10 mg by mouth daily.   ibuprofen 200 MG tablet Commonly known as: ADVIL Take 600 mg by mouth every 6 (six) hours as needed for mild pain.    lisinopril 20 MG tablet Commonly known as: ZESTRIL Take 40 mg by mouth daily.   magnesium oxide 400 MG tablet Commonly known as: MAG-OX Take 400 mg by mouth daily.   Multi-Vitamin tablet Take 1 tablet by mouth every morning.   oxyCODONE 5 MG immediate release tablet Commonly known as: Roxicodone Take 1 tablet (5 mg total) by mouth every 8 (eight) hours as needed. Alternate tylenol and ibuprofen for the first few days. Take narcotic pain medication only if needed for severe/ breakthrough pain.   potassium chloride 10 MEQ tablet Commonly known as: KLOR-CON Take 20 mEq by mouth daily.   torsemide 20 MG tablet Commonly known as: DEMADEX Take 20 mg by mouth every other day.        Follow-up Information     [facility], [place]. Schedule an appointment as soon as possible for a visit in 2 week(s).   Specialty: General Surgery Why: Call the office as soon as you get home to schedule an appointment in approximately 2 weeks for a postop checkup. Contact information: [address] [place] [address] [phone]               Allergies  Allergen Reactions   Avocado Nausea And  Vomiting   Eggs Or Egg-Derived Products Nausea And Vomiting    Consultations: General surgery   Procedures/Studies: DG Chest 2 View  Result Date: 08/06/2020 CLINICAL DATA:  Chest pain EXAM: CHEST - 2 VIEW COMPARISON:  11/13/2017 FINDINGS: Mild cardiomegaly. No focal opacity or pleural effusion. No pneumothorax. IMPRESSION: No active cardiopulmonary disease.  Cardiomegaly Electronically Signed   By: Jasmine Pang M.D.   On: 08/06/2020 17:49   DG Cholangiogram Operative  Result Date: 08/08/2020 CLINICAL DATA:  Cholelithiasis. EXAM: INTRAOPERATIVE CHOLANGIOGRAM TECHNIQUE: Cholangiographic images from the C-arm fluoroscopic device were submitted for interpretation post-operatively. Please see the procedural report for the amount of contrast and the  fluoroscopy time utilized. COMPARISON:  Ultrasound 08/06/2020 FINDINGS: Contrast opacification of the intrahepatic and extrahepatic biliary system. Mild fullness of the biliary system with drainage into the duodenum. No large filling defects or stones. IMPRESSION: Patent biliary system. Electronically Signed   By: Richarda Overlie M.D.   On: 08/08/2020 09:16   MR ABDOMEN MRCP W WO CONTAST  Result Date: 08/06/2020 CLINICAL DATA:  Right upper quadrant abdominal pain. Mild biliary dilatation. Gallstones. EXAM: MRI ABDOMEN WITHOUT AND WITH CONTRAST (INCLUDING MRCP) TECHNIQUE: Multiplanar multisequence MR imaging of the abdomen was performed both before and after the administration of intravenous contrast. Heavily T2-weighted images of the biliary and pancreatic ducts were obtained, and three-dimensional MRCP images were rendered by post processing. CONTRAST:  10mL GADAVIST GADOBUTROL 1 MMOL/ML IV SOLN COMPARISON:  08/06/2020 FINDINGS: Despite efforts by the technologist and patient, motion artifact is present on today's exam and could not be eliminated. This reduces exam sensitivity and specificity. Body habitus reduces diagnostic sensitivity and specificity. Lower chest: Cardiomegaly. Hepatobiliary: 2.8 cm hemangioma in the left hepatic lobe as shown on image 22 series 34. Several large gallstones are present in the gallbladder measuring up to 2.7 cm in diameter. The common hepatic duct measures 0.7 cm in diameter. The common bile duct measures 0.6 cm in diameter, mildly dilated. On image 7 of series 8, I suspect a small distal CBD stone, also suggested on 24-25 of series 3. Admittedly this is less apparent on other sequences. No abnormal enhancement along the biliary tree. No significant intrahepatic biliary dilatation. Pancreas:  Pancreas divisum. Spleen:  Unremarkable Adrenals/Urinary Tract:  Unremarkable Stomach/Bowel: Unremarkable Vascular/Lymphatic:  Unremarkable Other:  No supplemental non-categorized findings.  Musculoskeletal: Lower lumbar degenerative disc disease and degenerative endplate findings. IMPRESSION: 1. Cholelithiasis with mild extrahepatic biliary dilatation and suspicion for a small distal CBD stone on some sequences. Diagnostic accuracy is adversely affected by motion artifact and patient body habitus. 2. 2.8 cm hemangioma in the left hepatic lobe. 3. Cardiomegaly. 4. Pancreas divisum. 5. Lower lumbar degenerative disc disease. Electronically Signed   By: Gaylyn Rong M.D.   On: 08/06/2020 22:01   US Abdomen Limited RUQ (LIVER/GB)  Result Date: 08/06/2020 CLINICAL DATA:  Right upper quadrant pain EXAM: ULTRASOUND ABDOMEN LIMITED RIGHT UPPER QUADRANT COMPARISON:  None. FINDINGS: Gallbladder: Partially distended with multiple gallstones within. No pericholecystic fluid is noted. Negative sonographic Murphy's sign is elicited. Common bile duct: Diameter: 9.4 mm. Liver: Increased in echogenicity consistent with fatty infiltration. Intrahepatic biliary ductal dilatation is noted as well. Portal vein is patent on color Doppler imaging with normal direction of blood flow towards the liver. Other: None. IMPRESSION: Multiple gallstones within a partially distended gallbladder. Dilated common bile duct 9.4 mm with intrahepatic ductal dilatation. This raises suspicion for distal common bile duct stone. Electronically Signed   By: Eulah Pont.D.  On: 08/06/2020 16:24    Laparoscopic cholecystectomy and intraoperative cholangiogram.  Subjective: Patient was seen and examined at bedside.  Overnight events noted.   Patient denies any abdominal pain , She is able to pass flatus and had a bowel movement.   She has tolerated full liquid diet.  Patient wants to be discharged.  Discharge Exam: Vitals:   08/09/20 0641 08/09/20 0908  BP: 139/85   Pulse: 64   Resp: 18   Temp: 98.5 F (36.9 C) 98 F (36.7 C)  SpO2: 98%    Vitals:   08/08/20 2035 08/09/20 0156 08/09/20 0641 08/09/20 0908  BP: (!)  156/93 (!) 152/88 139/85   Pulse: 63 (!) 56 64   Resp: Temp: 98.4 F (36.9 C) 97.7 F (36.5 C) 98.5 F (36.9 C) 98 F (36.7 C)  TempSrc: Oral Oral Oral Oral  SpO2: 97% 94% 98%   Weight:      Height:        General: Pt is alert, awake, not in acute distress Cardiovascular: RRR, S1/S2 +, no rubs, no gallops Respiratory: CTA bilaterally, no wheezing, no rhonchi Abdominal: Soft, NT, ND, bowel sounds + Extremities: no edema, no cyanosis    The results of significant diagnostics from this hospitalization (including imaging, microbiology, ancillary and laboratory) are listed below for reference.     Microbiology: Recent Results (from the past 240 hour(s))  Resp Panel by RT-PCR (Flu A&B, Covid) Nasopharyngeal Swab     Status: None   Collection Time: 08/06/20  6:13 PM   Specimen: Nasopharyngeal Swab; Nasopharyngeal(NP) swabs in vial transport medium  Result Value Ref Range Status   SARS Coronavirus 2 by RT PCR NEGATIVE NEGATIVE Final    Comment: (NOTE) SARS-CoV-2 target nucleic acids are NOT DETECTED.  The SARS-CoV-2 RNA is generally detectable in upper respiratory specimens during the acute phase of infection. The lowest concentration of SARS-CoV-2 viral copies this assay can detect is 138 copies/mL. A negative result does not preclude SARS-Cov-2 infection and should not be used as the sole basis for treatment or other patient management decisions. A negative result may occur with  improper specimen collection/handling, submission of specimen other than nasopharyngeal swab, presence of viral mutation(s) within the areas targeted by this assay, and inadequate number of viral copies(<138 copies/mL). A negative result must be combined with clinical observations, patient history, and epidemiological information. The expected result is Negative.  Fact Sheet for Patients:  BloggerCourse.com  Fact Sheet for Healthcare Providers:   SeriousBroker.it  This test is no t yet approved or cleared by the [place] FDA and  has been authorized for detection and/or diagnosis of SARS-CoV-2 by FDA under an Emergency Use Authorization (EUA). This EUA will remain  in effect (meaning this test can be used) for the duration of the COVID-19 declaration under Section 564(b)(1) of the Act, 21 U.S.C.section 360bbb-3(b)(1), unless the authorization is terminated  or revoked sooner.       Influenza A by PCR NEGATIVE NEGATIVE Final   Influenza B by PCR NEGATIVE NEGATIVE Final    Comment: (NOTE) The Xpert Xpress SARS-CoV-2/FLU/RSV plus assay is intended as an aid in the diagnosis of influenza from Nasopharyngeal swab specimens and should not be used as a sole basis for treatment. Nasal washings and aspirates are unacceptable for Xpert Xpress SARS-CoV-2/FLU/RSV testing.  Fact Sheet for Patients: BloggerCourse.com  Fact Sheet for Healthcare Providers: SeriousBroker.it  This test is not yet approved or cleared by the [place] FDA  and has been authorized for detection and/or diagnosis of SARS-CoV-2 by FDA under an Emergency Use Authorization (EUA). This EUA will remain in effect (meaning this test can be used) for the duration of the COVID-19 declaration under Section 564(b)(1) of the Act, 21 U.S.C. section 360bbb-3(b)(1), unless the authorization is terminated or revoked.  Performed at [facility], [address] [place], [place] [id number]      Labs: BNP (last 3 results) No results for input(s): BNP in the last 8760 hours. Basic Metabolic Panel: Recent Labs  Lab 08/06/20 1555 08/07/20 0438 08/08/20 0418 08/09/20 0437  NA 142 141 140 140  K 4.3 3.7 3.6 4.0  CL 108 105 104 104  CO2 29 28 29 28   GLUCOSE 117* 105* 103* 129*  BUN 12 9 8 8   CREATININE 0.72 0.81 0.79 0.77  CALCIUM 9.3 9.0 9.0 9.2  MG  --   --   --   1.9  PHOS  --   --   --  3.5   Liver Function Tests: Recent Labs  Lab 08/06/20 1555 08/07/20 0438 08/08/20 0418 08/09/20 0437  AST 24 16 15  52*  ALT 15 15 14  39  ALKPHOS 92 87 82 86  BILITOT 0.9 0.8 1.0 0.9  PROT 7.8 7.1 6.8 7.3  ALBUMIN 4.2 3.7 3.6 3.8   Recent Labs  Lab 08/06/20 1555  LIPASE 35   No results for input(s): AMMONIA in the last 168 hours. CBC: Recent Labs  Lab 08/06/20 1555 08/07/20 0438 08/08/20 0418 08/09/20 0437  WBC 6.2 6.8 6.3 12.3*  NEUTROABS 4.5  --   --   --   HGB 10.0* 11.2* 10.9* 11.9*  HCT 31.4* 35.0* 34.4* 36.4  MCV 95.4 94.6 94.5 93.3  PLT 58* 199 202 215   Cardiac Enzymes: No results for input(s): CKTOTAL, CKMB, CKMBINDEX, TROPONINI in the last 168 hours. BNP: Invalid input(s): POCBNP CBG: No results for input(s): GLUCAP in the last 168 hours. D-Dimer No results for input(s): DDIMER in the last 72 hours. Hgb A1c No results for input(s): HGBA1C in the last 72 hours. Lipid Profile No results for input(s): CHOL, HDL, LDLCALC, TRIG, CHOLHDL, LDLDIRECT in the last 72 hours. Thyroid function studies No results for input(s): TSH, T4TOTAL, T3FREE, THYROIDAB in the last 72 hours.  Invalid input(s): FREET3 Anemia work up Recent Labs    08/06/20 1813  VITAMINB12 324  FOLATE 9.2  TIBC 330  IRON 31   Urinalysis No results found for: COLORURINE, APPEARANCEUR, LABSPEC, PHURINE, GLUCOSEU, HGBUR, BILIRUBINUR, KETONESUR, PROTEINUR, UROBILINOGEN, NITRITE, LEUKOCYTESUR Sepsis Labs Invalid input(s): PROCALCITONIN,  WBC,  LACTICIDVEN Microbiology Recent Results (from the past 240 hour(s))  Resp Panel by RT-PCR (Flu A&B, Covid) Nasopharyngeal Swab     Status: None   Collection Time: 08/06/20  6:13 PM   Specimen: Nasopharyngeal Swab; Nasopharyngeal(NP) swabs in vial transport medium  Result Value Ref Range Status   SARS Coronavirus 2 by RT PCR NEGATIVE NEGATIVE Final    Comment: (NOTE) SARS-CoV-2 target nucleic acids are NOT  DETECTED.  The SARS-CoV-2 RNA is generally detectable in upper respiratory specimens during the acute phase of infection. The lowest concentration of SARS-CoV-2 viral copies this assay can detect is 138 copies/mL. A negative result does not preclude SARS-Cov-2 infection and should not be used as the sole basis for treatment or other patient management decisions. A negative result may occur with  improper specimen collection/handling, submission of specimen other than nasopharyngeal swab, presence of viral mutation(s) within the  areas targeted by this assay, and inadequate number of viral copies(<138 copies/mL). A negative result must be combined with clinical observations, patient history, and epidemiological information. The expected result is Negative.  Fact Sheet for Patients:  BloggerCourse.com  Fact Sheet for Healthcare Providers:  SeriousBroker.it  This test is no t yet approved or cleared by the [place] FDA and  has been authorized for detection and/or diagnosis of SARS-CoV-2 by FDA under an Emergency Use Authorization (EUA). This EUA will remain  in effect (meaning this test can be used) for the duration of the COVID-19 declaration under Section 564(b)(1) of the Act, 21 U.S.C.section 360bbb-3(b)(1), unless the authorization is terminated  or revoked sooner.       Influenza A by PCR NEGATIVE NEGATIVE Final   Influenza B by PCR NEGATIVE NEGATIVE Final    Comment: (NOTE) The Xpert Xpress SARS-CoV-2/FLU/RSV plus assay is intended as an aid in the diagnosis of influenza from Nasopharyngeal swab specimens and should not be used as a sole basis for treatment. Nasal washings and aspirates are unacceptable for Xpert Xpress SARS-CoV-2/FLU/RSV testing.  Fact Sheet for Patients: BloggerCourse.com  Fact Sheet for Healthcare Providers: SeriousBroker.it  This test is not yet  approved or cleared by the [place] FDA and has been authorized for detection and/or diagnosis of SARS-CoV-2 by FDA under an Emergency Use Authorization (EUA). This EUA will remain in effect (meaning this test can be used) for the duration of the COVID-19 declaration under Section 564(b)(1) of the Act, 21 U.S.C. section 360bbb-3(b)(1), unless the authorization is terminated or revoked.  Performed at [facility], [address], [place], [place] [address]      Time coordinating discharge: Over 30 minutes  SIGNED:   Cipriano Bunker, MD  [facility] 08/09/2020, 9:38 AM Pager   If 7PM-7AM, please contact night-coverage [email or web address]

## 2020-08-09 NOTE — Progress Notes (Addendum)
1 Day Post-Op   Subjective/Chief Complaint: Had a decent night last night.  Reports her pain is well controlled.  Denies any nausea or bloating, tolerating p.o.  Objective: Vital signs in last 24 hours: Temp:  [97.5 F (36.4 C)-98.5 F (36.9 C)] 98.5 F (36.9 C) (07/04 0641) Pulse Rate:  [56-80] 64 (07/04 0641) Resp:  [15-18] 18 (07/04 0641) BP: (137-165)/(80-99) 139/85 (07/04 0641) SpO2:  [94 %-100 %] 98 % (07/04 0641) Last BM Date: 09/04/20  Intake/Output from previous day: 07/03 0701 - 07/04 0700 In: 2300 [P.O.:1300; I.V.:900; IV Piggyback:100] Out: 1130 [Urine:1100; Blood:30] Intake/Output this shift: No intake/output data recorded.  Alert, pleasant and comfortable appearing Unlabored respirations Abdomen is soft, nontender.  Incisions are all clean dry intact with Dermabond, no cellulitis or hematoma  Lab Results:  Recent Labs    08/08/20 0418 08/09/20 0437  WBC 6.3 12.3*  HGB 10.9* 11.9*  HCT 34.4* 36.4  PLT 202 215    BMET Recent Labs    08/08/20 0418 08/09/20 0437  NA 140 140  K 3.6 4.0  CL 104 104  CO2 29 28  GLUCOSE 103* 129*  BUN 8 8  CREATININE 0.79 0.77  CALCIUM 9.0 9.2    PT/INR No results for input(s): LABPROT, INR in the last 72 hours. ABG No results for input(s): PHART, HCO3 in the last 72 hours.  Invalid input(s): PCO2, PO2  Studies/Results: DG Cholangiogram Operative  Result Date: 08/08/2020 CLINICAL DATA:  Cholelithiasis. EXAM: INTRAOPERATIVE CHOLANGIOGRAM TECHNIQUE: Cholangiographic images from the C-arm fluoroscopic device were submitted for interpretation post-operatively. Please see the procedural report for the amount of contrast and the fluoroscopy time utilized. COMPARISON:  Ultrasound 08/06/2020 FINDINGS: Contrast opacification of the intrahepatic and extrahepatic biliary system. Mild fullness of the biliary system with drainage into the duodenum. No large filling defects or stones. IMPRESSION: Patent biliary system.  Electronically Signed   By: Richarda Overlie M.D.   On: 08/08/2020 09:16    Anti-infectives: Anti-infectives (From admission, onward)    Start     Dose/Rate Route Frequency Ordered Stop   08/08/20 0657  ceFAZolin (ANCEF) 2-4 GM/100ML-% IVPB       Note to Pharmacy: Armandina Gemma   : cabinet override      08/08/20 0657 08/08/20 0820   08/08/20 0600  ceFAZolin (ANCEF) IVPB 2g/100 mL premix        2 g 200 mL/hr over 30 Minutes Intravenous On call to O.R. 08/07/20 1416 08/08/20 [id number]       Assessment/Plan: Postop day 1 status post laparoscopic cholecystectomy with negative intraoperative cholangiogram.  Okay for discharge home.  LOS: 3 days    Berna Bue 08/09/2020

## 2020-08-09 NOTE — Progress Notes (Signed)
Pt alert and oriented, tolerating diet, passing gas. D/C instructions reviewed, pt d/cd to home.

## 2020-08-10 ENCOUNTER — Encounter (HOSPITAL_COMMUNITY): Payer: Self-pay | Admitting: Surgery

## 2020-08-10 LAB — SURGICAL PATHOLOGY

## 2021-11-11 ENCOUNTER — Ambulatory Visit (HOSPITAL_COMMUNITY)
Admission: RE | Admit: 2021-11-11 | Discharge: 2021-11-11 | Disposition: A | Discharge status: Home / Self Care | Payer: 59 | Source: Ambulatory Visit | Attending: Physician Assistant | Admitting: Physician Assistant

## 2021-11-11 ENCOUNTER — Encounter (HOSPITAL_COMMUNITY): Payer: Self-pay

## 2021-11-11 VITALS — BP 123/82 | HR 61 | Temp 98.4°F | Resp 16

## 2021-11-11 DIAGNOSIS — R509 Fever, unspecified: Secondary | ICD-10-CM | POA: Diagnosis present

## 2021-11-11 DIAGNOSIS — Z1152 Encounter for screening for COVID-19: Secondary | ICD-10-CM | POA: Insufficient documentation

## 2021-11-11 DIAGNOSIS — R519 Headache, unspecified: Secondary | ICD-10-CM | POA: Diagnosis present

## 2021-11-11 DIAGNOSIS — J069 Acute upper respiratory infection, unspecified: Secondary | ICD-10-CM | POA: Insufficient documentation

## 2021-11-11 DIAGNOSIS — R059 Cough, unspecified: Secondary | ICD-10-CM | POA: Diagnosis present

## 2021-11-11 LAB — RESP PANEL BY RT-PCR (FLU A&B, COVID) ARPGX2
Influenza A by PCR: NEGATIVE
Influenza B by PCR: NEGATIVE
SARS Coronavirus 2 by RT PCR: NEGATIVE

## 2021-11-11 MED ORDER — PROMETHAZINE-DM 6.25-15 MG/5ML PO SYRP: 5.0000 mL | ORAL_SOLUTION | Freq: Four times a day (QID) | ORAL | 0 refills | Status: AC | PRN

## 2021-11-11 NOTE — Discharge Instructions (Signed)
Will call with test results Recommend rest and plenty of fluids Recommend Flonase and Mucinex Can take cough syrup as needed for cough Return if symptoms become worse or you develop shortness of breath or trouble breathing

## 2021-11-11 NOTE — ED Provider Notes (Signed)
[place]    CSN: [id number] Arrival date & time: 11/11/21  1556      History   Chief Complaint Chief Complaint  Patient presents with   Cough   Nasal Congestion   Fever   Headache    HPI Katelyn Preston is a 56 y.o. female.   Pt presents with four days of cough, congestion, headache.  Reports subjective fever.  Postnasal drip, congestion, cough worse at bedtime when she is lying flat.  She reports her brother is sick with similar sx.  She has not had a COVID test. She reports productive cough.  Denies shortness of breath, n/v/d, body aches. She has been drinking tea and taking emergen-C.     Past Medical History:  Diagnosis Date   A-fib [facility])    CHF (congestive heart failure) ([place])    Hypertension     Patient Active Problem List   Diagnosis Date Noted   Dilation of common bile duct 08/06/2020   Symptomatic cholelithiasis 08/06/2020   Thrombocytopenia ([place]) 08/06/2020   Anemia 08/06/2020   AF (paroxysmal atrial fibrillation) ([facility]) 08/06/2020   Chronic diastolic CHF (congestive heart failure) ([place]) 08/06/2020   HTN (hypertension) 08/06/2020   OSA (obstructive sleep apnea) 08/06/2020   Morbid obesity with BMI of 50.0-59.9, adult ([place]) 08/06/2020    Past Surgical History:  Procedure Laterality Date   BACK SURGERY     CARDIOVERSION     CESAREAN SECTION     CHOLECYSTECTOMY N/A 08/08/2020   Procedure: LAPAROSCOPIC CHOLECYSTECTOMY WITH INTRAOPERATIVE CHOLANGIOGRAM;  Surgeon: Clovis Riley, MD;  Location: [facility] ORS;  Service: General;  Laterality: N/A;    OB History   No obstetric history on file.      Home Medications    Prior to Admission medications   Medication Sig Start Date End Date Taking? Authorizing Provider  amLODipine (NORVASC) 10 MG tablet Take 10 mg by mouth daily.   Yes [provider]  apixaban (ELIQUIS) 5 MG TABS tablet Take 10 mg by mouth daily.   Yes [provider]  atenolol (TENORMIN) 50 MG tablet Take 50  mg by mouth daily.   Yes [provider]  Biotin 1000 MCG tablet Take 1,000 mcg by mouth daily.   Yes [provider]  lisinopril (PRINIVIL,ZESTRIL) 20 MG tablet Take 40 mg by mouth daily. 12/05/16  Yes [provider]  magnesium oxide (MAG-OX) 400 (240 Mg) MG tablet Take 1 tablet by mouth 2 (two) times daily. 06/06/21  Yes [provider]  Multiple Vitamin (MULTI-VITAMIN) tablet Take 1 tablet by mouth every morning.   Yes [provider]  potassium chloride (KLOR-CON) 10 MEQ tablet Take 20 mEq by mouth daily.   Yes [provider]  promethazine-dextromethorphan (PROMETHAZINE-DM) 6.25-15 MG/5ML syrup Take 5 mLs by mouth 4 (four) times daily as needed for cough. 11/11/21  Yes Ward, Lenise Arena, PA-C  torsemide (DEMADEX) 20 MG tablet Take 20 mg by mouth every other day.   Yes [provider]  albuterol (PROVENTIL HFA;VENTOLIN HFA) 108 (90 Base) MCG/ACT inhaler Inhale 2 puffs into the lungs every 4 (four) hours as needed for wheezing or shortness of breath (cough, shortness of breath or wheezing.). 11/13/17   Robyn Haber, MD  chlorhexidine (PERIDEX) 0.12 % solution Use as directed 15 mLs in the mouth or throat 2 (two) times daily. 07/06/20   Scot Jun, FNP  ibuprofen (ADVIL) 200 MG tablet Take 600 mg by mouth every 6 (six) hours as needed for mild  pain.    [provider]    Family History History reviewed. No pertinent family history.  Social History Social History   Tobacco Use   Smoking status: Never   Smokeless tobacco: Never  Vaping Use   Vaping Use: Never used  Substance Use Topics   Alcohol use: Never   Drug use: Never     Allergies   Avocado and Eggs or egg-derived products   Review of Systems Review of Systems  Constitutional:  Positive for fever. Negative for chills.  HENT:  Positive for congestion and sinus pressure. Negative for ear pain and sore throat.   Eyes:  Negative for pain and visual  disturbance.  Respiratory:  Positive for cough. Negative for shortness of breath.   Cardiovascular:  Negative for chest pain and palpitations.  Gastrointestinal:  Negative for abdominal pain and vomiting.  Genitourinary:  Negative for dysuria and hematuria.  Musculoskeletal:  Negative for arthralgias and back pain.  Skin:  Negative for color change and rash.  Neurological:  Negative for seizures and syncope.  All other systems reviewed and are negative.    Physical Exam Triage Vital Signs ED Triage Vitals  Enc Vitals Group     BP 11/11/21 1620 123/82     Pulse Rate 11/11/21 1620 61     Resp 11/11/21 1620 16     Temp 11/11/21 1620 98.4 F (36.9 C)     Temp Source 11/11/21 1620 Oral     SpO2 11/11/21 1620 96 %     Weight --      Height --      Head Circumference --      Peak Flow --      Pain Score 11/11/21 1623 7     Pain Loc --      Pain Edu? --      Excl. in GC? --    No data found.  Updated Vital Signs BP 123/82 (BP Location: Right Wrist)   Pulse 61   Temp 98.4 F (36.9 C) (Oral)   Resp 16   SpO2 96%   Visual Acuity Right Eye Distance:   Left Eye Distance:   Bilateral Distance:    Right Eye Near:   Left Eye Near:    Bilateral Near:     Physical Exam Vitals and nursing note reviewed.  Constitutional:      General: She is not in acute distress.    Appearance: She is well-developed.  HENT:     Head: Normocephalic and atraumatic.  Eyes:     Conjunctiva/sclera: Conjunctivae normal.  Cardiovascular:     Rate and Rhythm: Normal rate and regular rhythm.     Heart sounds: No murmur heard. Pulmonary:     Effort: Pulmonary effort is normal. No respiratory distress.     Breath sounds: Normal breath sounds.  Abdominal:     Palpations: Abdomen is soft.     Tenderness: There is no abdominal tenderness.  Musculoskeletal:        General: No swelling.     Cervical back: Neck supple.  Skin:    General: Skin is warm and dry.     Capillary Refill: Capillary  refill takes less than 2 seconds.  Neurological:     Mental Status: She is alert.  Psychiatric:        Mood and Affect: Mood normal.      UC Treatments / Results  Labs (all labs ordered are listed, but only abnormal results are displayed) Labs Reviewed  RESP PANEL BY RT-PCR (FLU A&B, COVID) [id number]    EKG   Radiology No results found.  Procedures Procedures (including critical care time)  Medications Ordered in UC Medications - No data to display  Initial Impression / Assessment and Plan / UC Course  I have reviewed the triage vital signs and the nursing notes.  Pertinent labs & imaging results that were available during my care of the patient were reviewed by me and considered in my medical decision making (see chart for details).     URI, COVID/flu test pending.  Vitals wnl, lungs clear, no acute distress, stable for discharge with supportive care.  Return precautions discussed.  Final Clinical Impressions(s) / UC Diagnoses   Final diagnoses:  Viral upper respiratory tract infection     Discharge Instructions      Will call with test results Recommend rest and plenty of fluids Recommend Flonase and Mucinex Can take cough syrup as needed for cough Return if symptoms become worse or you develop shortness of breath or trouble breathing   ED Prescriptions     Medication Sig Dispense Auth. Provider   promethazine-dextromethorphan (PROMETHAZINE-DM) 6.25-15 MG/5ML syrup Take 5 mLs by mouth 4 (four) times daily as needed for cough. 118 mL Ward, Tylene Fantasia, PA-C      PDMP not reviewed this encounter.   Ward, Tylene Fantasia, PA-C 11/11/21 [phone]

## 2021-11-11 NOTE — ED Triage Notes (Signed)
Cough, fever, congestion and headache onset Tuesday. States her brother was sick first but has not been tested. Productive cough with yellow mucus.

## 2021-11-14 ENCOUNTER — Telehealth (HOSPITAL_COMMUNITY): Payer: Self-pay | Admitting: Emergency Medicine

## 2023-03-11 IMAGING — MR MR ABDOMEN WO/W CM MRCP
17 of 21 series · 37 of 48 positions shown · IV contrast (gadavist)
Comparison: 08/06/2020

CLINICAL DATA: Right upper quadrant abdominal pain. Mild biliary
dilatation. Gallstones.

EXAM:
MRI ABDOMEN WITHOUT AND WITH CONTRAST (INCLUDING MRCP)
TECHNIQUE: Multiplanar multisequence MR imaging of the abdomen was performed
both before and after the administration of intravenous contrast.
Heavily T2-weighted images of the biliary and pancreatic ducts were
obtained, and three-dimensional MRCP images were rendered by post
processing.
CONTRAST:  10mL GADAVIST GADOBUTROL 1 MMOL/ML IV SOLN

[Series 3: T2 fat-sat · axial · 6.0mm · 1.41mm/px · 1 of 36 slices shown]
[im 1/36]
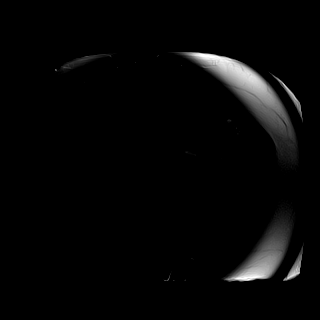

[Series 5: T2 · coronal · 6.0mm · 1.88mm/px · 1 of 35 slices shown (1 of 2)]
[im 1/35]
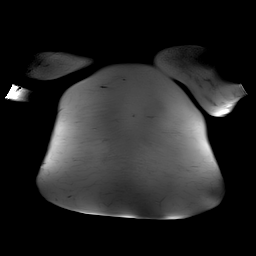

[Series 6: DWI · axial · 6.0mm · 1.68mm/px · 1 of 72 slices shown (1 of 2)]
[im 1/72]
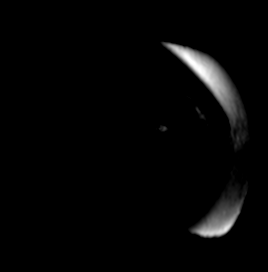

[Series 7: DWI · axial · 6.0mm · 1.68mm/px · 1 of 36 slices shown (2 of 2)]
[im 1/36]
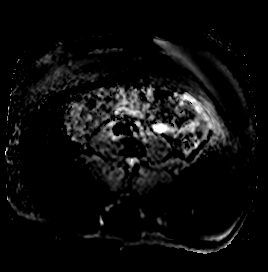

[Series 9: T1 · axial · 3.0mm · 1.41mm/px · z∈[-140,+121]mm · 3 of 88 slices shown (1 of 2)]
[im 1/88]
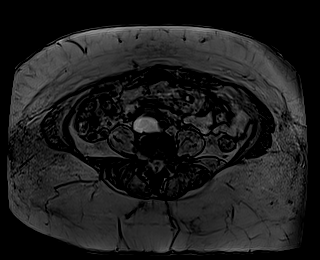
[im 44/88]
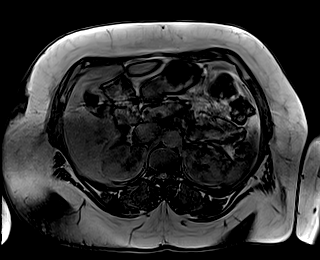
[im 88/88]
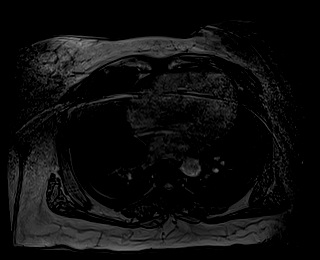

[Series 10: T1 · axial · 3.0mm · 1.41mm/px · z∈[-140,+121]mm · 3 of 88 slices shown (2 of 2)]
[im 1/88]
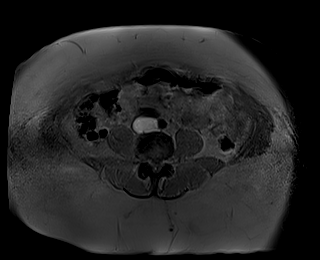
[im 44/88]
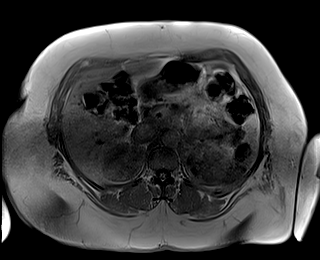
[im 88/88]
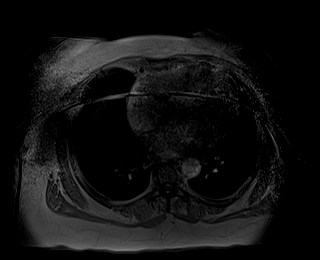

[Series 11: cor obl thk · sagittal · 50.0mm · 0.78mm/px · 1 of 9 slices shown]
[im 1/9]
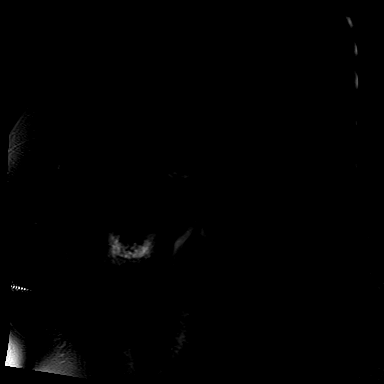

[Series 12: cor_3d_spc_trig · coronal · 1.0mm · 0.49mm/px · 3 of 79 slices shown]
[im 1/79]
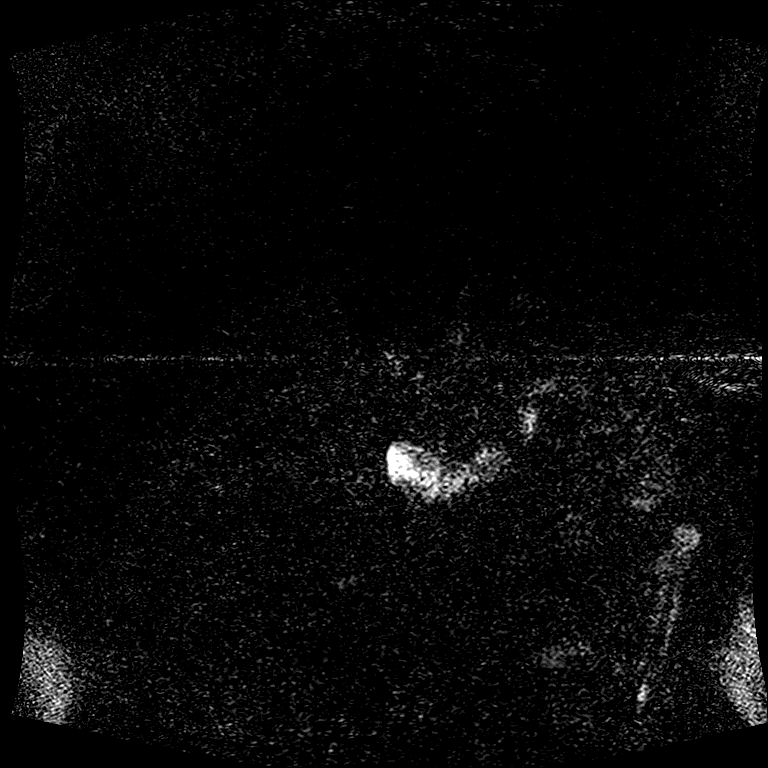
[im 40/79]
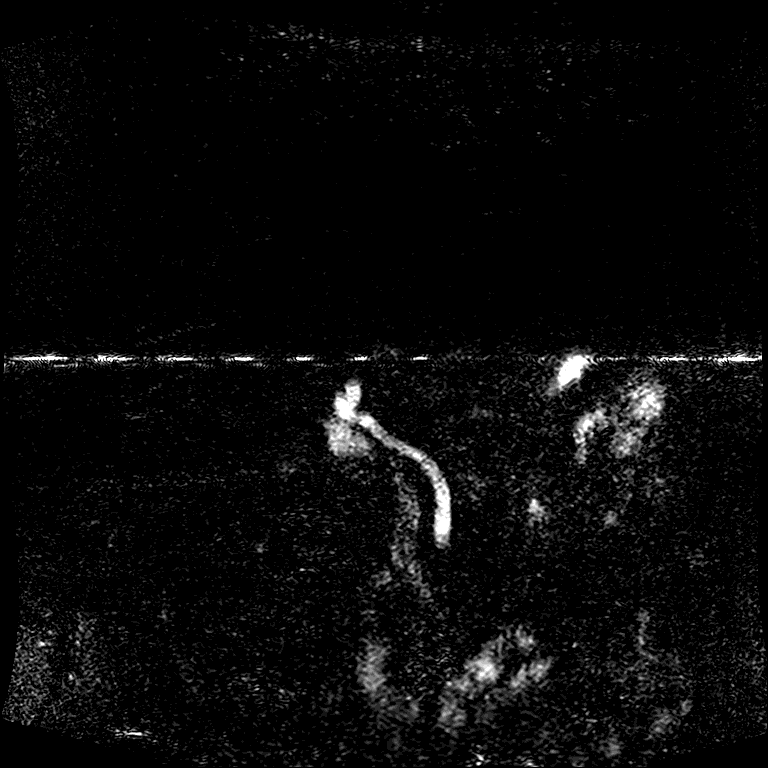
[im 79/79]
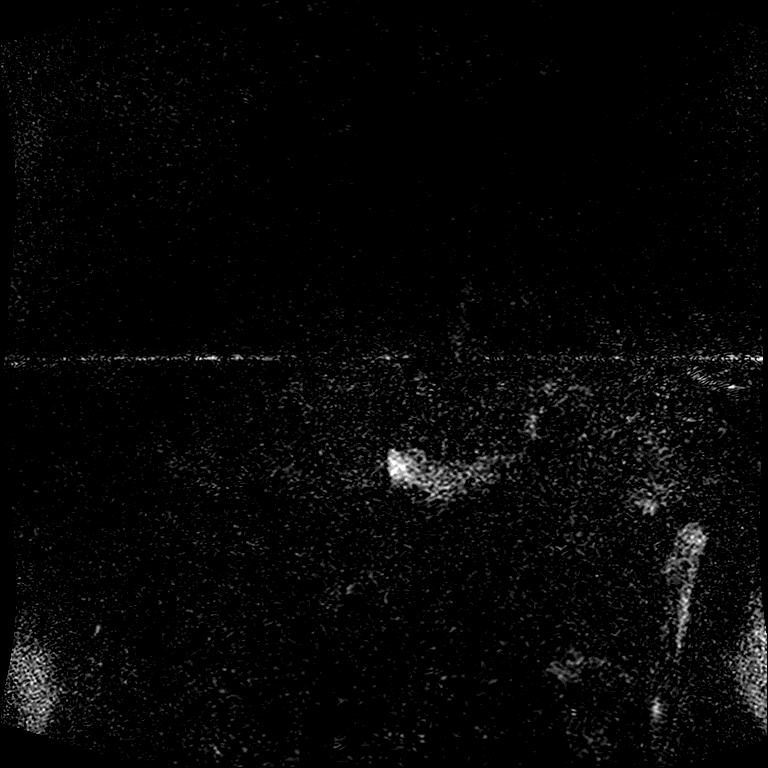

[Series 14: T2 · axial · 6.0mm · 1.76mm/px · 1 of 35 slices shown (2 of 2)]
[im 1/35]
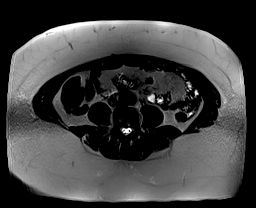

[Series 16: T1 dynamic · axial · 3.0mm · 1.41mm/px · z∈[-140,+121]mm · 3 of 88 slices shown (1 of 8)]
[im 1/88]
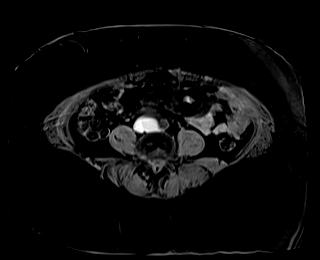
[im 44/88]
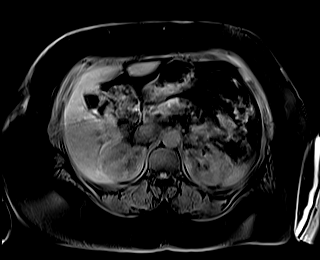
[im 88/88]
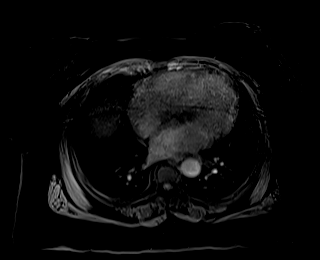

[Series 20: T1 dynamic · axial · 3.0mm · 1.41mm/px · z∈[-140,+121]mm · 3 of 88 slices shown (2 of 8)]
[im 1/88]
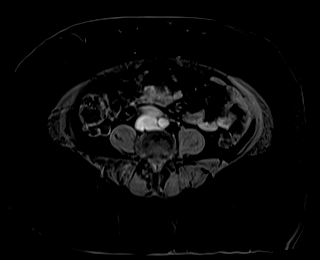
[im 44/88]
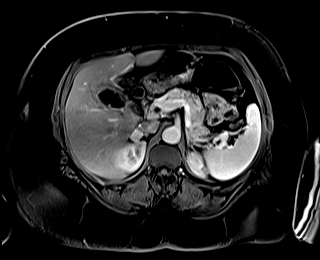
[im 88/88]
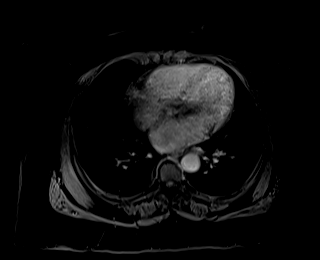

[Series 21: T1 dynamic · axial · 3.0mm · 1.41mm/px · z∈[-140,+121]mm · 3 of 88 slices shown (3 of 8)]
[im 1/88]
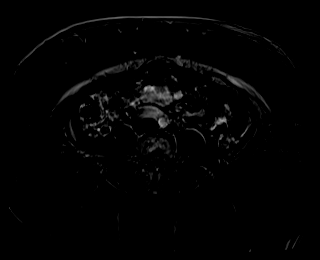
[im 44/88]
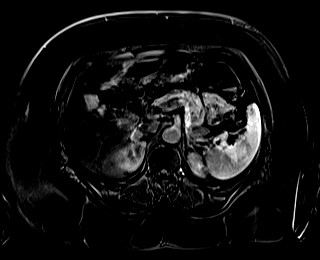
[im 88/88]
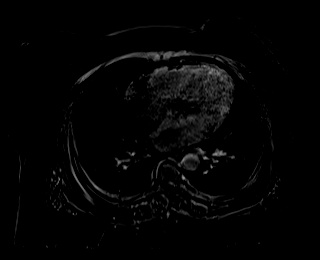

[Series 24: T1 dynamic · axial · 3.0mm · 1.41mm/px · z∈[-140,+121]mm · 3 of 88 slices shown (4 of 8)]
[im 1/88]
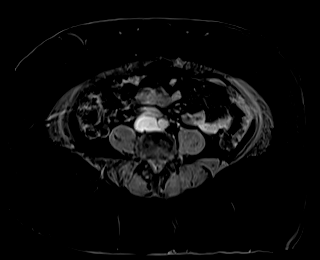
[im 44/88]
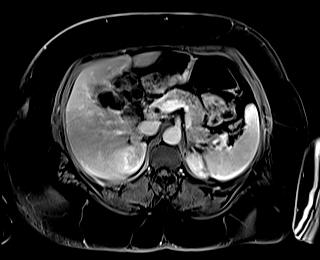
[im 88/88]
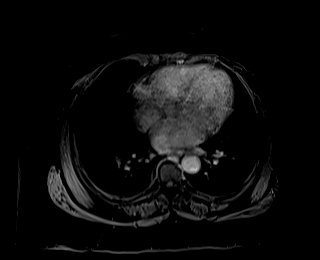

[Series 25: T1 dynamic · axial · 3.0mm · 1.41mm/px · z∈[-140,+121]mm · 3 of 88 slices shown (5 of 8)]
[im 1/88]
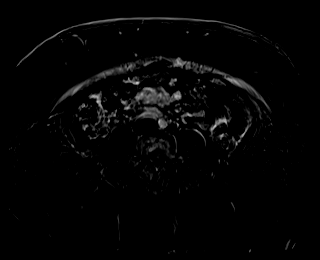
[im 44/88]
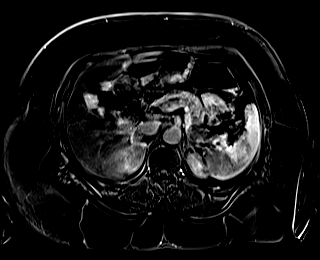
[im 88/88]
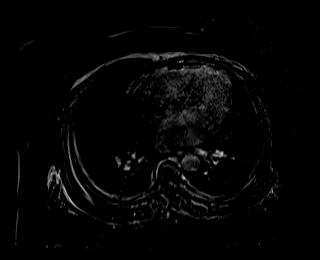

[Series 28: T1 dynamic · axial · 3.0mm · 1.41mm/px · z∈[-140,+121]mm · 3 of 88 slices shown (6 of 8)]
[im 1/88]
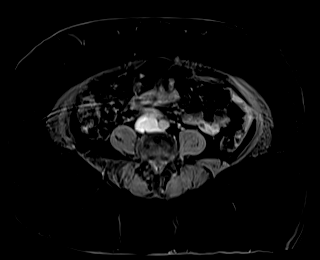
[im 44/88]
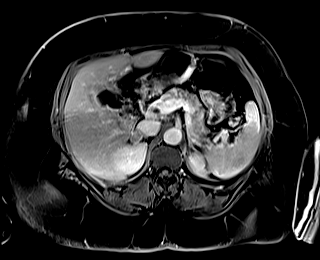
[im 88/88]
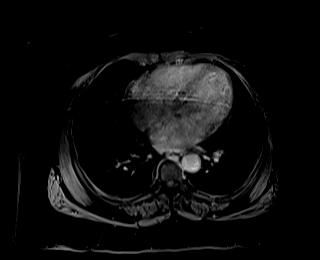

[Series 29: T1 dynamic · axial · 3.0mm · 1.41mm/px · z∈[-140,+121]mm · 3 of 88 slices shown (7 of 8)]
[im 1/88]
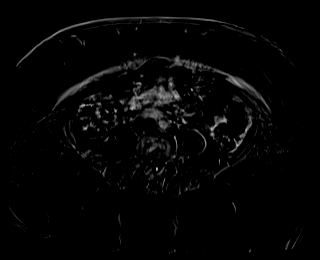
[im 44/88]
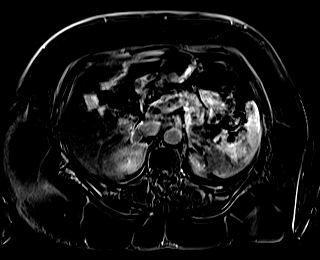
[im 88/88]
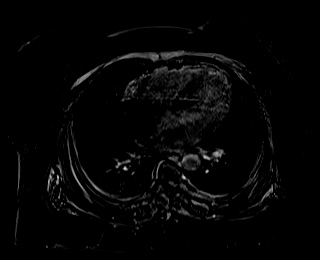

[Series 31: T1 dynamic · coronal · 3.0mm · 1.41mm/px · 1 of 80 slices shown (8 of 8)]
[im 1/80]
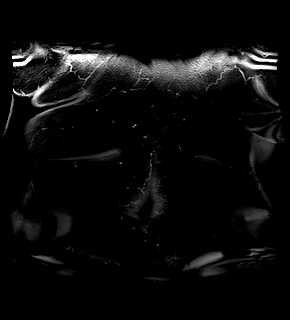

[37 of 48 positions shown; findings below may reference images not displayed]

FINDINGS: Despite efforts by the technologist and patient, motion artifact is
present on today's exam and could not be eliminated. This reduces
exam sensitivity and specificity. Body habitus reduces diagnostic
sensitivity and specificity.

Lower chest: Cardiomegaly.

Hepatobiliary: 2.8 cm hemangioma in the left hepatic lobe as shown
on image 22 series 34.

Several large gallstones are present in the gallbladder measuring up
to 2.7 cm in diameter. The common hepatic duct measures 0.7 cm in
diameter. The common bile duct measures 0.6 cm in diameter, mildly
dilated. On image 7 of series 8, I suspect a small distal CBD stone,
also suggested on 24-25 of series 3. Admittedly this is less
apparent on other sequences. No abnormal enhancement along the
biliary tree. No significant intrahepatic biliary dilatation.

Pancreas:  Pancreas divisum.

Spleen:  Unremarkable

Adrenals/Urinary Tract:  Unremarkable

Stomach/Bowel: Unremarkable

Vascular/Lymphatic:  Unremarkable

Other:  No supplemental non-categorized findings.

Musculoskeletal: Lower lumbar degenerative disc disease and
degenerative endplate findings.
IMPRESSION: 1. Cholelithiasis with mild extrahepatic biliary dilatation and
suspicion for a small distal CBD stone on some sequences. Diagnostic
accuracy is adversely affected by motion artifact and patient body
habitus.
2. 2.8 cm hemangioma in the left hepatic lobe.
3. Cardiomegaly.
4. Pancreas divisum.
5. Lower lumbar degenerative disc disease.
